# Patient Record
Sex: Female | Born: 1980 | Hispanic: No | Marital: Single | Smoking: Current every day smoker
Health system: Southern US, Community
[De-identification: ages and names within clinical notes are randomized; demographics above are authoritative.]

## PROBLEM LIST (undated history)

## (undated) DIAGNOSIS — B009 Herpesviral infection, unspecified: Secondary | ICD-10-CM

## (undated) DIAGNOSIS — I10 Essential (primary) hypertension: Secondary | ICD-10-CM

## (undated) DIAGNOSIS — N920 Excessive and frequent menstruation with regular cycle: Secondary | ICD-10-CM

## (undated) DIAGNOSIS — Q625 Duplication of ureter: Secondary | ICD-10-CM

## (undated) DIAGNOSIS — K859 Acute pancreatitis without necrosis or infection, unspecified: Secondary | ICD-10-CM

## (undated) DIAGNOSIS — E785 Hyperlipidemia, unspecified: Secondary | ICD-10-CM

## (undated) HISTORY — DX: Essential (primary) hypertension: I10

## (undated) HISTORY — PX: CHOLECYSTECTOMY: SHX55

## (undated) HISTORY — DX: Hyperlipidemia, unspecified: E78.5

## (undated) HISTORY — DX: Excessive and frequent menstruation with regular cycle: N92.0

## (undated) HISTORY — PX: OTHER SURGICAL HISTORY: SHX169

## (undated) HISTORY — DX: Duplication of ureter: Q62.5

---
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
## 2007-11-16 DIAGNOSIS — Q625 Duplication of ureter: Secondary | ICD-10-CM

## 2007-11-16 HISTORY — DX: Duplication of ureter: Q62.5

## 2007-11-16 HISTORY — PX: OTHER SURGICAL HISTORY: SHX169

## 2007-11-17 ENCOUNTER — Ambulatory Visit (HOSPITAL_BASED_OUTPATIENT_CLINIC_OR_DEPARTMENT_OTHER): Admission: RE | Admit: 2007-11-17 | Discharge: 2007-11-17 | Payer: Self-pay | Admitting: Gynecology

## 2007-11-17 ENCOUNTER — Encounter: Payer: Self-pay | Admitting: Gynecology

## 2008-05-28 ENCOUNTER — Emergency Department (HOSPITAL_COMMUNITY): Admission: EM | Admit: 2008-05-28 | Discharge: 2008-05-29 | Payer: Self-pay | Admitting: Emergency Medicine

## 2008-11-27 ENCOUNTER — Ambulatory Visit: Payer: Self-pay | Admitting: Diagnostic Radiology

## 2008-11-27 ENCOUNTER — Emergency Department (HOSPITAL_BASED_OUTPATIENT_CLINIC_OR_DEPARTMENT_OTHER): Admission: EM | Admit: 2008-11-27 | Discharge: 2008-11-27 | Payer: Self-pay | Admitting: Emergency Medicine

## 2010-06-05 ENCOUNTER — Emergency Department (HOSPITAL_BASED_OUTPATIENT_CLINIC_OR_DEPARTMENT_OTHER): Admission: EM | Admit: 2010-06-05 | Discharge: 2010-06-05 | Payer: Self-pay | Admitting: Emergency Medicine

## 2010-10-10 ENCOUNTER — Emergency Department (HOSPITAL_BASED_OUTPATIENT_CLINIC_OR_DEPARTMENT_OTHER): Admission: EM | Admit: 2010-10-10 | Discharge: 2010-10-10 | Payer: Self-pay | Admitting: Emergency Medicine

## 2011-01-30 LAB — RAPID STREP SCREEN (MED CTR MEBANE ONLY): Streptococcus, Group A Screen (Direct): NEGATIVE

## 2011-03-30 NOTE — Op Note (Signed)
Anita Montoya, Anita Montoya                ACCOUNT NO.:  [id number]   MEDICAL RECORD NO.:  [id number]         PATIENT TYPE:  HAMB   LOCATION:                               FACILITY:  [facility]   PHYSICIAN:  Timothy P. Fontaine, M.D.DATE OF BIRTH:  [birth year]   DATE OF PROCEDURE:  11/17/2007  DATE OF DISCHARGE:                               OPERATIVE REPORT   THERE IS NO DICTATION FOR THIS JOB NUMBER.      Timothy P. Fontaine, M.D.  Electronically Signed     TPF/MEDQ  D:  11/17/2007  T:  11/17/2007  Job:  [id number]

## 2011-03-30 NOTE — Op Note (Signed)
Anita Montoya, Anita Montoya                ACCOUNT NO.:  [id number]   MEDICAL RECORD NO.:  [id number]          PATIENT TYPE:  AMB   LOCATION:  [facility]                         FACILITY:  [facility]   PHYSICIAN:  Timothy P. Fontaine, M.D.DATE OF BIRTH:  1981-04-22   DATE OF PROCEDURE:  11/17/2007  DATE OF DISCHARGE:                               OPERATIVE REPORT   PREOPERATIVE DIAGNOSIS:  Endometrial polyp.   POSTOPERATIVE DIAGNOSIS:  Endometrial polyp.   PROCEDURE:  Hysteroscopic endometrial polypectomy, D&C.   SURGEON:  Timothy P. Fontaine, M.D.   ANESTHETIC:  General with 1% lidocaine paracervical block.   COMPLICATIONS:  None.   ESTIMATED BLOOD LOSS:  Minimal, sorbitol discrepancy minimal.   SPECIMEN:  1. Endometrial curetting.  2. Endometrial polyp to pathology   FINDINGS:  EUA:  External BUS, vagina normal.  Cervix normal.  Bimanual:  Uterus grossly normal.  Adnexa without gross masses.  Hysteroscopic with  classic endometrial polyp from mid posterior endometrial cavity removed  in its entirety intact.  Hysteroscopy was otherwise normal and adequate,  noting an atrophic endometrial pattern.   PROCEDURE:  The patient was taken to the operating room and underwent  general anesthesia, was placed in the low dorsal lithotomy position,  received a perineal/vaginal preparation with Betadine solution.  Bladder  emptied with in-and-out Foley catheterization.  EUA performed and the  patient draped in the usual fashion.  The cervix was visualized with the  speculum and anterior lip grasped with a single-tooth tenaculum.  Paracervical block using 1% lidocaine was placed, a total of 10 mL.  The  cervix was gently gradually dilated to admit the operative hysteroscope  and hysteroscopy was performed with findings noted above.  Using the  right-angle resectoscopic loupe, the polyp was excised at its base and  removed intact, sent to pathology.  A sharp curettage was then performed  with scant  return, consistent with the atrophic type pattern on the  endometrium, and this was also sent to pathology as a separate specimen.  Re-hysteroscopy showed good distention, no evidence of perforation.  No  abnormalities.  The  instruments were removed, tenaculum removed.  Hemostasis visualized.  Speculum removed.  The patient placed in the supine position, awakened  without difficulty, and taken to the recovery room in good condition,  having received Toradol in the operating room.      Timothy P. Fontaine, M.D.  Electronically Signed     TPF/MEDQ  D:  11/17/2007  T:  11/17/2007  Job:  [id number]

## 2011-04-02 NOTE — H&P (Signed)
Anita Montoya, Anita Montoya                ACCOUNT NO.:  [id number]   MEDICAL RECORD NO.:  [id number]           PATIENT TYPE:   LOCATION:                                 FACILITY:   PHYSICIAN:  Timothy P. Fontaine, M.D.DATE OF BIRTH:  [birth year]   DATE OF ADMISSION:  10/27/2007  DATE OF DISCHARGE:                              HISTORY & PHYSICAL   CHIEF COMPLAINT:  1. Irregular bleeding.  2. Endometrial polyp.   HISTORY OF PRESENT ILLNESS:  A 30 year old, gravida 0 with a long  history of irregular bleeding with long intervals of amenorrhea.  Outpatient evaluation included a sonohystogram which showed an  endometrial defect consistent with a polyp.  She is admitted at this  time for a hysteroscopy, D&C and removal of her polyp.   PAST MEDICAL HISTORY:  Significant for hypertension.   PAST SURGICAL HISTORY:  None.   ALLERGIES:  No medications.   CURRENT MEDICATIONS:  Lisinopril 10 mg daily.   REVIEW OF SYSTEMS:  Noncontributory.   FAMILY HISTORY:  Noncontributory.   SOCIAL HISTORY:  Noncontributory.   ADMISSION PHYSICAL EXAMINATION:  VITAL SIGNS:  Afebrile.  Vital signs  are stable.  HEENT: Normal.  LUNGS:  Clear.  CARDIAC:  Regular rate.  No rubs, murmurs or gallops.  ABDOMINAL:  Obese without gross masses, tenderness, rebound,  organomegaly.  PELVIC:  External BUS, vagina normal.  Cervix grossly  normal.  Uterus normal size, limited by abdominal girth.  Adnexa without  gross masses or tenderness.   ASSESSMENT:  A 30 year old, gravida 0 with a long history of irregular  menses consistent with a PCO (polycystic ovary)-type pattern with long  bouts of amenorrhea.  Evaluation as an outpatient included a  sonohystogram which showed an endometrial defect consistent with an  endometrial polyp.   She is admitted at this time for hysteroscopy, D&C and removal of the  polyp.  I reviewed the proposed surgery with the patient, the expected  intraoperative/postoperative courses, use  of the resectoscope, D&C.  The  acute risks to include uterine perforation, damage to internal organs  either immediately recognized, delay recognized including bowel,  bladder, ureters, vessels and nerves necessitating major exploratory  reparative surgeries, future reparative surgeries, bowel resection,  ostomy formation was all discussed, understood and accepted.  The risks  of hemorrhage necessitating transfusion and risks of transfusion  including transfusion reaction, hepatitis, HIV, mad cow disease and  other unknown entities was all discussed, understood and accepted.  The  risks of infection requiring prolonged antibiotics, opening and draining  of abscesses all reviewed with her.  Absorption of distension medium,  metabolic complications including coma and seizures was reviewed.  Long-  term management of her irregular menses was also discussed, which will  be further discussed postoperatively pending pathology results from the  [facility].  The patient's questions were answered to her satisfaction.  She is  ready to proceed with surgery.      Timothy P. Fontaine, M.D.  Electronically Signed     TPF/MEDQ  D:  10/24/2007  T:  10/24/2007  Job:  [id number]

## 2011-08-05 LAB — POCT PREGNANCY, URINE
Operator id: 118191
Preg Test, Ur: NEGATIVE

## 2011-08-12 LAB — POCT I-STAT, CHEM 8
BUN: 8
Calcium, Ion: 1.16
Chloride: 105
HCT: 45
Potassium: 3.5
Sodium: 141

## 2011-08-12 LAB — DIFFERENTIAL
Basophils Absolute: 0.2 — ABNORMAL HIGH
Basophils Relative: 1
Eosinophils Absolute: 0.4
Eosinophils Relative: 3
Lymphs Abs: 3.5
Neutrophils Relative %: 62

## 2011-08-12 LAB — CBC
HCT: 42.6
MCV: 77.9 — ABNORMAL LOW
Platelets: 414 — ABNORMAL HIGH
RDW: 15
WBC: 13.3 — ABNORMAL HIGH

## 2011-08-12 LAB — URINALYSIS, ROUTINE W REFLEX MICROSCOPIC
Glucose, UA: NEGATIVE
Ketones, ur: 15 — AB
Nitrite: NEGATIVE
Specific Gravity, Urine: 1.028
pH: 5.5

## 2011-08-12 LAB — HEPATIC FUNCTION PANEL
Albumin: 3.6
Alkaline Phosphatase: 88
Indirect Bilirubin: 0.4
Total Protein: 7.7

## 2011-08-12 LAB — LIPASE, BLOOD: Lipase: 19

## 2013-08-15 DIAGNOSIS — E785 Hyperlipidemia, unspecified: Secondary | ICD-10-CM

## 2013-08-15 HISTORY — DX: Hyperlipidemia, unspecified: E78.5

## 2013-12-15 ENCOUNTER — Encounter (INDEPENDENT_AMBULATORY_CARE_PROVIDER_SITE_OTHER): Payer: Self-pay

## 2013-12-15 ENCOUNTER — Ambulatory Visit: Payer: Self-pay | Attending: Internal Medicine | Admitting: Family Medicine

## 2013-12-15 VITALS — BP 184/141 | HR 94 | Temp 98.9°F | Resp 14 | Ht 66.0 in | Wt 308.8 lb

## 2013-12-15 DIAGNOSIS — I10 Essential (primary) hypertension: Secondary | ICD-10-CM

## 2013-12-15 DIAGNOSIS — Z23 Encounter for immunization: Secondary | ICD-10-CM

## 2013-12-15 DIAGNOSIS — N760 Acute vaginitis: Secondary | ICD-10-CM

## 2013-12-15 DIAGNOSIS — N898 Other specified noninflammatory disorders of vagina: Secondary | ICD-10-CM | POA: Insufficient documentation

## 2013-12-15 DIAGNOSIS — Z124 Encounter for screening for malignant neoplasm of cervix: Secondary | ICD-10-CM

## 2013-12-15 DIAGNOSIS — N920 Excessive and frequent menstruation with regular cycle: Secondary | ICD-10-CM

## 2013-12-15 DIAGNOSIS — L680 Hirsutism: Secondary | ICD-10-CM

## 2013-12-15 DIAGNOSIS — E669 Obesity, unspecified: Secondary | ICD-10-CM | POA: Insufficient documentation

## 2013-12-15 DIAGNOSIS — N921 Excessive and frequent menstruation with irregular cycle: Secondary | ICD-10-CM

## 2013-12-15 LAB — CBC
HEMATOCRIT: 45.7 % (ref 36.0–46.0)
Hemoglobin: 15.5 g/dL — ABNORMAL HIGH (ref 12.0–15.0)
MCH: 26.1 pg (ref 26.0–34.0)
MCHC: 33.9 g/dL (ref 30.0–36.0)
MCV: 76.8 fL — AB (ref 78.0–100.0)
Platelets: 436 10*3/uL — ABNORMAL HIGH (ref 150–400)
RBC: 5.95 MIL/uL — ABNORMAL HIGH (ref 3.87–5.11)
RDW: 14.9 % (ref 11.5–15.5)
WBC: 11.1 10*3/uL — ABNORMAL HIGH (ref 4.0–10.5)

## 2013-12-15 LAB — LIPID PANEL
CHOLESTEROL: 178 mg/dL (ref 0–200)
HDL: 44 mg/dL (ref >39)
LDL CALC: 115 mg/dL — AB (ref 0–99)
TRIGLYCERIDES: 94 mg/dL (ref <150)
Total CHOL/HDL Ratio: 4 Ratio
VLDL: 19 mg/dL (ref 0–40)

## 2013-12-15 LAB — COMPREHENSIVE METABOLIC PANEL
ALBUMIN: 4.2 g/dL (ref 3.5–5.2)
ALK PHOS: 90 U/L (ref 39–117)
ALT: 20 U/L (ref 0–35)
AST: 14 U/L (ref 0–37)
BILIRUBIN TOTAL: 0.3 mg/dL (ref 0.2–1.2)
BUN: 9 mg/dL (ref 6–23)
CO2: 28 mEq/L (ref 19–32)
Calcium: 9.2 mg/dL (ref 8.4–10.5)
Chloride: 102 mEq/L (ref 96–112)
Creat: 0.69 mg/dL (ref 0.50–1.10)
GLUCOSE: 60 mg/dL — AB (ref 70–99)
POTASSIUM: 3.5 meq/L (ref 3.5–5.3)
Sodium: 138 mEq/L (ref 135–145)
Total Protein: 7.8 g/dL (ref 6.0–8.3)

## 2013-12-15 LAB — POCT URINE PREGNANCY: PREG TEST UR: NEGATIVE

## 2013-12-15 LAB — TSH: TSH: 1.024 u[IU]/mL (ref 0.350–4.500)

## 2013-12-15 MED ORDER — HYDROCHLOROTHIAZIDE 25 MG PO TABS: 25.0000 mg | ORAL_TABLET | Freq: Every day | ORAL | Status: DC

## 2013-12-15 MED ORDER — MEDROXYPROGESTERONE ACETATE 10 MG PO TABS: 10.0000 mg | ORAL_TABLET | Freq: Every day | ORAL | Status: DC

## 2013-12-15 MED ORDER — AMLODIPINE BESYLATE 10 MG PO TABS: 10.0000 mg | ORAL_TABLET | Freq: Every day | ORAL | Status: DC

## 2013-12-15 NOTE — Assessment & Plan Note (Addendum)
A: uncontrolled due to not having medication, P: BMP today  Start HCTZ 25 daily and norvasc 10 daily

## 2013-12-15 NOTE — Progress Notes (Signed)
Pt is here to establish care. Has a history of hypertension and needs refills on all medications. Complains of abdominal pain x1 month; has irregular menstrual periods that will last up to 3 months. Also complains of a burning sensation in the lower abdomen; pain in lower back in the mornings. No pain today. Taking Tylenol OTC for pain; has helped pain.

## 2013-12-15 NOTE — Assessment & Plan Note (Addendum)
A: enlarged uterus concerning for uterine fibroids on exam. Negative U preg.  P: CBC Gc/Chlam Pap Uterine u/s TSH Provera 10 mg daily x 5 days

## 2013-12-15 NOTE — Assessment & Plan Note (Signed)
A: obesity in the setting of hirsutism and AUB concerning for PCOS.  P:  Pelvic US Checked CBG-normal at 60 TSH normal  CBC wnl, ? Hemoconcentration

## 2013-12-15 NOTE — Assessment & Plan Note (Signed)
?   PCOS. Pending pelvic U/S. Normal CBGs. No diabetes.

## 2013-12-15 NOTE — Patient Instructions (Signed)
[name],  Thank you for coming in today. I was a pleasure meeting you.   Please start provera one tab daily starting tomorrow for 5 days. Repeat monthly as needed as we work up this  Bleeding.  Two new BP meds.  I will be in touch with results including ultrasound. Plan to f/u in 2 weeks for BP recheck. Plan to f/u with MD in 4-6 weeks   Dr. Armen Pickup   Dysfunctional Uterine Bleeding Normally, menstrual periods begin between ages 8 to 67 in young women. A normal menstrual cycle/period may begin every 23 days up to 35 days and lasts from 1 to 7 days. Around 12 to 14 days before your menstrual period starts, ovulation (ovary produces an egg) occurs. When counting the time between menstrual periods, count from the first day of bleeding of the previous period to the first day of bleeding of the next period. Dysfunctional (abnormal) uterine bleeding is bleeding that is different from a normal menstrual period. Your periods may come earlier or later than usual. They may be lighter, have blood clots or be heavier. You may have bleeding between periods, or you may skip one period or more. You may have bleeding after sexual intercourse, bleeding after menopause, or no menstrual period. CAUSES   Pregnancy (normal, miscarriage, tubal).  IUDs (intrauterine device, birth control).  Birth control pills.  Hormone treatment.  Menopause.  Infection of the cervix.  Blood clotting problems.  Infection of the inside lining of the uterus.  Endometriosis, inside lining of the uterus growing in the pelvis and other female organs.  Adhesions (scar tissue) inside the uterus.  Obesity or severe weight loss.  Uterine polyps inside the uterus.  Cancer of the vagina, cervix, or uterus.  Ovarian cysts or polycystic ovary syndrome.  Medical problems (diabetes, thyroid disease).  Uterine fibroids (noncancerous tumor).  Problems with your female hormones.  Endometrial hyperplasia, very thick  lining and enlarged cells inside of the uterus.  Medicines that interfere with ovulation.  Radiation to the pelvis or abdomen.  Chemotherapy. DIAGNOSIS   Your doctor will discuss the history of your menstrual periods, medicines you are taking, changes in your weight, stress in your life, and any medical problems you may have.  Your doctor will do a physical and pelvic examination.  Your doctor may want to perform certain tests to make a diagnosis, such as:  Pap test.  Blood tests.  Cultures for infection.  CT scan.  Ultrasound.  Hysteroscopy.  Laparoscopy.  MRI.  Hysterosalpingography.  D and C.  Endometrial biopsy. TREATMENT  Treatment will depend on the cause of the dysfunctional uterine bleeding (DUB). Treatment may include:  Observing your menstrual periods for a couple of months.  Prescribing medicines for medical problems, including:  Antibiotics.  Hormones.  Birth control pills.  Removing an IUD (intrauterine device, birth control).  Surgery:  D and C (scrape and remove tissue from inside the uterus).  Laparoscopy (examine inside the abdomen with a lighted tube).  Uterine ablation (destroy lining of the uterus with electrical current, laser, heat, or freezing).  Hysteroscopy (examine cervix and uterus with a lighted tube).  Hysterectomy (remove the uterus). HOME CARE INSTRUCTIONS   If medicines were prescribed, take exactly as directed. Do not change or switch medicines without consulting your caregiver.  Long term heavy bleeding may result in iron deficiency. Your caregiver may have prescribed iron pills. They help replace the iron that your body lost from heavy bleeding. Take exactly as directed.  Do not take aspirin or medicines that contain aspirin one week before or during your menstrual period. Aspirin may make the bleeding worse.  If you need to change your sanitary pad or tampon more than once every 2 hours, stay in bed with your  feet elevated and a cold pack on your lower abdomen. Rest as much as possible, until the bleeding stops or slows down.  Eat well-balanced meals. Eat foods high in iron. Examples are:  Leafy green vegetables.  Whole-grain breads and cereals.  Eggs.  Meat.  Liver.  Do not try to lose weight until the abnormal bleeding has stopped and your blood iron level is back to normal. Do not lift more than ten pounds or do strenuous activities when you are bleeding.  For a couple of months, make note on your calendar, marking the start and ending of your period, and the type of bleeding (light, medium, heavy, spotting, clots or missed periods). This is for your caregiver to better evaluate your problem. SEEK MEDICAL CARE IF:   You develop nausea (feeling sick to your stomach) and vomiting, dizziness, or diarrhea while you are taking your medicine.  You are getting lightheaded or weak.  You have any problems that may be related to the medicine you are taking.  You develop pain with your DUB.  You want to remove your IUD.  You want to stop or change your birth control pills or hormones.  You have any type of abnormal bleeding mentioned above.  You are over 35 years old and have not had a menstrual period yet.  You are 33 years old and you are still having menstrual periods.  You have any of the symptoms mentioned above.  You develop a rash. SEEK IMMEDIATE MEDICAL CARE IF:   An oral temperature above 102 F (38.9 C) develops.  You develop chills.  You are changing your sanitary pad or tampon more than once an hour.  You develop abdominal pain.  You pass out or faint. Document Released: 10/29/2000 Document Revised: 01/24/2012 Document Reviewed: 09/30/2009 [facility] Patient Information 2014 [place], [place].

## 2013-12-15 NOTE — Progress Notes (Signed)
   Subjective:    Patient ID: Anita Montoya, female    DOB: 12-15-1980, 33 y.o.   MRN: [id number]  HPI 33 year old female presents to establish primary care and discuss the following:  #1 vaginal bleeding: Patient reports 3 months of daily vaginal bleeding ranging from light to moderate. This is in  setting of a history of irregular periods since menarche (age 33). Bleeding is associated with a mild to moderate bilateral lower abdominal cramping. There has been no associated fever, chills, weight loss, nausea, vomiting. She admits to easy bruising, gum bleeding. No bleeding from other sites. Patient is sexually active ith one long-term partner is not using contraception. She's previously used OCPs in the past but they worsened her hirsutism.  #2 obesity: Patient with obesity since childhood. She's also had facial hair chest has since puberty. She's never been pregnant has never tried to become pregnant. She reports a family history of her maternal on having similar abnormal uterine bleeding, facial hair, obesity and difficulty becoming pregnant. She reports her mother had no similar symptoms. She has no sisters. She has never had issues with hyperglycemia.  #3 HTN: since 1995. No medications x many months due to lack of prescriber. Denies HA, vision change, CP, SOB and LE edema.   Past Surgical History  Procedure Laterality Date  . Gallbadder removal    . Uterine poly removed   2009    heavy menstrual bleeding    Review of Systems As per history of present illness    Objective:   Physical Exam BP 184/141  Pulse 94  Temp(Src) 98.9 F (37.2 C) (Oral)  Resp 14  Ht 5\' 6"  (1.676 m)  Wt 308 lb 12.8 oz (140.071 kg)  BMI 49.87 kg/m2  SpO2 98%  LMP 09/13/2013 General appearance: alert, cooperative, no distress and morbidly obese Lungs: clear to auscultation bilaterally Heart: regular rate and rhythm, S1, S2 normal, no murmur, click, rub or gallop Abdomen: obeses, NABS, mild TTP b/l LQ w/o  rebound or guarding  Pelvic:  External genitalia: normal Vagina: normal w/o lesions. Scant white curd like and mucoid discharge. Cervix:  Nulliparous. Scant blood. No CMT Bimanual: uterus enlarge, with palpable mass on L side most notable.  B/l adnexal tenderness w/o rebound or guarding. No mass. Ext: no edema  Skin: hirsutism noted on chin and lower face and anterior chest   U preg: negative      Assessment & Plan:

## 2013-12-17 ENCOUNTER — Telehealth: Payer: Self-pay | Admitting: *Deleted

## 2013-12-17 NOTE — Telephone Encounter (Signed)
Left a voicemail for pt to give us a call back. 

## 2013-12-17 NOTE — Telephone Encounter (Signed)
Contacted pt to notify her of her US Pelvic appointment on 12/20/13 at 9:00am. Pt must have a full bladder and arrive 15 minutes prior.

## 2013-12-18 ENCOUNTER — Encounter: Payer: Self-pay | Admitting: Family Medicine

## 2013-12-20 ENCOUNTER — Ambulatory Visit (HOSPITAL_COMMUNITY)
Admission: RE | Admit: 2013-12-20 | Discharge: 2013-12-20 | Disposition: A | Discharge status: Home / Self Care | Payer: PRIVATE HEALTH INSURANCE | Source: Ambulatory Visit | Attending: Family Medicine | Admitting: Family Medicine

## 2013-12-20 ENCOUNTER — Telehealth: Payer: Self-pay | Admitting: *Deleted

## 2013-12-20 DIAGNOSIS — N920 Excessive and frequent menstruation with regular cycle: Secondary | ICD-10-CM | POA: Insufficient documentation

## 2013-12-20 DIAGNOSIS — N921 Excessive and frequent menstruation with irregular cycle: Secondary | ICD-10-CM

## 2013-12-20 DIAGNOSIS — N83209 Unspecified ovarian cyst, unspecified side: Secondary | ICD-10-CM | POA: Insufficient documentation

## 2013-12-20 NOTE — Telephone Encounter (Signed)
Message copied by Ivone Licht, [place] R on Thu Dec 20, 2013  4:32 PM ------      Message from: Dessa PhiFUNCHES, JOSALYN      Created: Thu Dec 20, 2013  8:51 AM       [place], please inform patient via phone.       Normal pap, will repeat in 3 years.       Negative GC and chlam.       ------

## 2013-12-21 ENCOUNTER — Telehealth: Payer: Self-pay | Admitting: Family Medicine

## 2013-12-21 NOTE — Telephone Encounter (Signed)
Called patient. Normal pelvic US. No fibroids.  Simple follicular cyst in L ovary mildly enlarged. Reassuring U/S.   Normal pap. Negative Gc/chlam.  Patient reports taking provera x 5 days. Still bleeding, very light.  Having some sharp cramps. Comes and goes. No fever, chills, N/V.   Plan: Reassurance Repeat provera next month is needed.

## 2014-03-31 ENCOUNTER — Emergency Department (HOSPITAL_BASED_OUTPATIENT_CLINIC_OR_DEPARTMENT_OTHER): Payer: PRIVATE HEALTH INSURANCE

## 2014-03-31 ENCOUNTER — Emergency Department (HOSPITAL_BASED_OUTPATIENT_CLINIC_OR_DEPARTMENT_OTHER)
Admission: EM | Admit: 2014-03-31 | Discharge: 2014-03-31 | Disposition: A | Discharge status: Home / Self Care | Payer: PRIVATE HEALTH INSURANCE | Attending: Emergency Medicine | Admitting: Emergency Medicine

## 2014-03-31 ENCOUNTER — Encounter (HOSPITAL_BASED_OUTPATIENT_CLINIC_OR_DEPARTMENT_OTHER): Payer: Self-pay | Admitting: Emergency Medicine

## 2014-03-31 DIAGNOSIS — F17200 Nicotine dependence, unspecified, uncomplicated: Secondary | ICD-10-CM | POA: Insufficient documentation

## 2014-03-31 DIAGNOSIS — Z79899 Other long term (current) drug therapy: Secondary | ICD-10-CM | POA: Insufficient documentation

## 2014-03-31 DIAGNOSIS — I10 Essential (primary) hypertension: Secondary | ICD-10-CM | POA: Insufficient documentation

## 2014-03-31 DIAGNOSIS — N3090 Cystitis, unspecified without hematuria: Secondary | ICD-10-CM | POA: Insufficient documentation

## 2014-03-31 DIAGNOSIS — Q649 Congenital malformation of urinary system, unspecified: Secondary | ICD-10-CM | POA: Insufficient documentation

## 2014-03-31 DIAGNOSIS — N390 Urinary tract infection, site not specified: Secondary | ICD-10-CM

## 2014-03-31 DIAGNOSIS — E785 Hyperlipidemia, unspecified: Secondary | ICD-10-CM | POA: Insufficient documentation

## 2014-03-31 LAB — URINALYSIS, ROUTINE W REFLEX MICROSCOPIC
GLUCOSE, UA: NEGATIVE mg/dL
Ketones, ur: 15 mg/dL — AB
Nitrite: NEGATIVE
PH: 6 (ref 5.0–8.0)
PROTEIN: 100 mg/dL — AB
Specific Gravity, Urine: 1.027 (ref 1.005–1.030)
Urobilinogen, UA: 0.2 mg/dL (ref 0.0–1.0)

## 2014-03-31 LAB — URINE MICROSCOPIC-ADD ON

## 2014-03-31 LAB — PREGNANCY, URINE: Preg Test, Ur: NEGATIVE

## 2014-03-31 MED ORDER — PHENAZOPYRIDINE HCL 200 MG PO TABS: 200.0000 mg | ORAL_TABLET | Freq: Three times a day (TID) | ORAL | Status: DC

## 2014-03-31 MED ORDER — CEPHALEXIN 500 MG PO CAPS: 500.0000 mg | ORAL_CAPSULE | Freq: Four times a day (QID) | ORAL | Status: DC

## 2014-03-31 MED ORDER — CEPHALEXIN 250 MG PO CAPS
1000.0000 mg | ORAL_CAPSULE | Freq: Once | ORAL | Status: AC
  Administered 2014-03-31: 1000 mg via ORAL
  Filled 2014-03-31: qty 4

## 2014-03-31 MED ORDER — PHENAZOPYRIDINE HCL 100 MG PO TABS
200.0000 mg | ORAL_TABLET | Freq: Once | ORAL | Status: AC
  Administered 2014-03-31: 200 mg via ORAL
  Filled 2014-03-31: qty 2

## 2014-03-31 MED ORDER — CEPHALEXIN 250 MG PO CAPS
500.0000 mg | ORAL_CAPSULE | Freq: Once | ORAL | Status: DC
  Filled 2014-03-31: qty 2

## 2014-03-31 NOTE — ED Provider Notes (Signed)
CSN: [id number]     Arrival date & time 03/31/14  1801 History   First MD Initiated Contact with Patient 03/31/14 1817     Chief Complaint  Patient presents with  . Flank Pain     (Consider location/radiation/quality/duration/timing/severity/associated sxs/prior Treatment) Patient is a 33 y.o. female presenting with flank pain. The history is provided by the patient. No language interpreter was used.  Flank Pain This is a new problem. Pertinent negatives include no abdominal pain, fever, myalgias, nausea or rash. Associated symptoms comments: Left flank pain, hematuria and painful urination for the past 1 day. Pain is only associated with urination. No nausea, vomiting or fever. .    Past Medical History  Diagnosis Date  . HLD (hyperlipidemia) 08/2013  . Menorrhagia 1989  . Duplicated left renal collecting system 2009    noted on 2009 CT abdomen and pelvis   . Hypertension 1995   Past Surgical History  Procedure Laterality Date  . Gallbadder removal    . Uterine poly removed   2009    heavy menstrual bleeding   . Cholecystectomy     Family History  Problem Relation Age of Onset  . Diabetes Father   . Heart disease Brother    History  Substance Use Topics  . Smoking status: Current Some Day Smoker -- 1.00 packs/day    Types: Cigarettes  . Smokeless tobacco: Never Used  . Alcohol Use: Yes     Comment: occasional   OB History   Grav Para Term Preterm Abortions TAB SAB Ect Mult Living   0 0             Review of Systems  Constitutional: Negative for fever.  Gastrointestinal: Negative for nausea and abdominal pain.  Genitourinary: Positive for dysuria, hematuria and flank pain.  Musculoskeletal: Negative for myalgias.  Skin: Negative for rash.      Allergies  Review of patient's allergies indicates no known allergies.  Home Medications   Prior to Admission medications   Medication Sig Start Date End Date Taking? Authorizing Provider  amLODipine (NORVASC) 10  MG tablet Take 1 tablet (10 mg total) by mouth daily. 12/15/13  Yes Josalyn C Funches, MD  hydrochlorothiazide (HYDRODIURIL) 25 MG tablet Take 1 tablet (25 mg total) by mouth daily. 12/15/13  Yes Josalyn C Funches, MD  medroxyPROGESTERone (PROVERA) 10 MG tablet Take 1 tablet (10 mg total) by mouth daily. One tab daily for five days starting on the first day of the month. Repeat monthly to regulate cycle. 12/15/13   Josalyn C Funches, MD   BP 164/108  Pulse 119  Temp(Src) 97.9 F (36.6 C) (Oral)  Resp 24  Ht 5\' 3"  (1.6 m)  Wt 315 lb (142.883 kg)  BMI 55.81 kg/m2  SpO2 99% Physical Exam  Constitutional: She is oriented to person, place, and time. She appears well-developed and well-nourished. No distress.  Neck: Normal range of motion.  Pulmonary/Chest: Effort normal.  Abdominal: There is no tenderness.  Obese abdomen that is soft.   Genitourinary:  No left CVA tenderness.   Neurological: She is alert and oriented to person, place, and time.  Skin: Skin is warm and dry.    ED Course  Procedures (including critical care time) Labs Review Labs Reviewed  URINALYSIS, ROUTINE W REFLEX MICROSCOPIC - Abnormal; Notable for the following:    Color, Urine AMBER (*)    APPearance CLOUDY (*)    Hgb urine dipstick MODERATE (*)    Bilirubin Urine SMALL (*)  Ketones, ur 15 (*)    Protein, ur 100 (*)    Leukocytes, UA SMALL (*)    All other components within normal limits  URINE MICROSCOPIC-ADD ON - Abnormal; Notable for the following:    Bacteria, UA FEW (*)    All other components within normal limits  PREGNANCY, URINE    Imaging Review Ct Abdomen Pelvis Wo Contrast  03/31/2014   CLINICAL DATA:  Left flank pain.  Hematuria.  EXAM: CT ABDOMEN AND PELVIS WITHOUT CONTRAST  TECHNIQUE: Multidetector CT imaging of the abdomen and pelvis was performed following the standard protocol without IV contrast.  COMPARISON:  US PELVIS COMPLETE dated 12/20/2013; CT ABD W/CM dated 05/29/2008; CT PELVIS W/CM  dated 05/29/2008  FINDINGS: Visualization of the lower thorax demonstrates no consolidative or nodular pulmonary opacities. Normal heart size.  Lack of intravenous contrast material limits evaluation of the solid organ parenchyma. Liver is diffusely low in attenuation compatible with hepatic steatosis. Status post cholecystectomy. Spleen, pancreas and bilateral adrenal glands are unremarkable. Kidneys are symmetric in size. No hydronephrosis.  Urinary bladder is decompressed with significant surrounding fat stranding. There is a 3.4 cm left adnexal cystic lesion, grossly similar in size and appearance compared to CT 05/29/2008. Colon is decompressed. Normal appendix. No abnormal bowel wall thickening or evidence for bowel obstruction. No free fluid or free intraperitoneal air.  No aggressive or acute appearing osseous lesions. Lower lumbar spine degenerative change.  IMPRESSION: Urinary bladder is decompressed however there is extensive surrounding fat stranding. Recommend correlation with urinalysis as findings are suggestive of acute cystitis.   Electronically Signed   By: Annia Beltrew  Davis M.D.   On: 03/31/2014 19:37     EKG Interpretation None      MDM   Final diagnoses:  None    1. Cystitis  She is well appearing. No ureteral or renal stones visualized. Findings on CT c/w cystitis. Abx, pyridium and PCP follow up recommended.     Arnoldo HookerShari A Elexius Minar, PA-C 03/31/14 2017

## 2014-03-31 NOTE — ED Notes (Signed)
Left flank pain this week- hematuria and dysuria x 1 day

## 2014-03-31 NOTE — Discharge Instructions (Signed)
Urinary Tract Infection  Urinary tract infections (UTIs) can develop anywhere along your urinary tract. Your urinary tract is your body's drainage system for removing wastes and extra water. Your urinary tract includes two kidneys, two ureters, a bladder, and a urethra. Your kidneys are a pair of bean-shaped organs. Each kidney is about the size of your fist. They are located below your ribs, one on each side of your spine.  CAUSES  Infections are caused by microbes, which are microscopic organisms, including fungi, viruses, and bacteria. These organisms are so small that they can only be seen through a microscope. Bacteria are the microbes that most commonly cause UTIs.  SYMPTOMS   Symptoms of UTIs may vary by age and gender of the patient and by the location of the infection. Symptoms in young women typically include a frequent and intense urge to urinate and a painful, burning feeling in the bladder or urethra during urination. Older women and men are more likely to be tired, shaky, and weak and have muscle aches and abdominal pain. A fever may mean the infection is in your kidneys. Other symptoms of a kidney infection include pain in your back or sides below the ribs, nausea, and vomiting.  DIAGNOSIS  To diagnose a UTI, your caregiver will ask you about your symptoms. Your caregiver also will ask to provide a urine sample. The urine sample will be tested for bacteria and white blood cells. White blood cells are made by your body to help fight infection.  TREATMENT   Typically, UTIs can be treated with medication. Because most UTIs are caused by a bacterial infection, they usually can be treated with the use of antibiotics. The choice of antibiotic and length of treatment depend on your symptoms and the type of bacteria causing your infection.  HOME CARE INSTRUCTIONS   If you were prescribed antibiotics, take them exactly as your caregiver instructs you. Finish the medication even if you feel better after you  have only taken some of the medication.   Drink enough water and fluids to keep your urine clear or pale yellow.   Avoid caffeine, tea, and carbonated beverages. They tend to irritate your bladder.   Empty your bladder often. Avoid holding urine for long periods of time.   Empty your bladder before and after sexual intercourse.   After a bowel movement, women should cleanse from front to back. Use each tissue only once.  SEEK MEDICAL CARE IF:    You have back pain.   You develop a fever.   Your symptoms do not begin to resolve within 3 days.  SEEK IMMEDIATE MEDICAL CARE IF:    You have severe back pain or lower abdominal pain.   You develop chills.   You have nausea or vomiting.   You have continued burning or discomfort with urination.  MAKE SURE YOU:    Understand these instructions.   Will watch your condition.   Will get help right away if you are not doing well or get worse.  Document Released: 08/11/2005 Document Revised: 05/02/2012 Document Reviewed: 12/10/2011  [employer] Patient Information 2014 [employer].

## 2014-04-02 NOTE — ED Provider Notes (Signed)
Medical screening examination/treatment/procedure(s) were performed by non-physician practitioner and as supervising physician I was immediately available for consultation/collaboration.   EKG Interpretation None       Anita HornJohn M Emerald Shor, MD 04/02/14 [phone]

## 2014-05-28 ENCOUNTER — Emergency Department (HOSPITAL_BASED_OUTPATIENT_CLINIC_OR_DEPARTMENT_OTHER)
Admission: EM | Admit: 2014-05-28 | Discharge: 2014-05-28 | Disposition: A | Discharge status: Home / Self Care | Payer: PRIVATE HEALTH INSURANCE | Attending: Emergency Medicine | Admitting: Emergency Medicine

## 2014-05-28 ENCOUNTER — Emergency Department (HOSPITAL_BASED_OUTPATIENT_CLINIC_OR_DEPARTMENT_OTHER): Payer: PRIVATE HEALTH INSURANCE

## 2014-05-28 ENCOUNTER — Encounter (HOSPITAL_BASED_OUTPATIENT_CLINIC_OR_DEPARTMENT_OTHER): Payer: Self-pay | Admitting: Emergency Medicine

## 2014-05-28 DIAGNOSIS — Z79899 Other long term (current) drug therapy: Secondary | ICD-10-CM | POA: Insufficient documentation

## 2014-05-28 DIAGNOSIS — Z8742 Personal history of other diseases of the female genital tract: Secondary | ICD-10-CM | POA: Insufficient documentation

## 2014-05-28 DIAGNOSIS — Y9389 Activity, other specified: Secondary | ICD-10-CM | POA: Insufficient documentation

## 2014-05-28 DIAGNOSIS — F17200 Nicotine dependence, unspecified, uncomplicated: Secondary | ICD-10-CM | POA: Insufficient documentation

## 2014-05-28 DIAGNOSIS — S93402A Sprain of unspecified ligament of left ankle, initial encounter: Secondary | ICD-10-CM

## 2014-05-28 DIAGNOSIS — Y9289 Other specified places as the place of occurrence of the external cause: Secondary | ICD-10-CM | POA: Insufficient documentation

## 2014-05-28 DIAGNOSIS — Z792 Long term (current) use of antibiotics: Secondary | ICD-10-CM | POA: Insufficient documentation

## 2014-05-28 DIAGNOSIS — Z87718 Personal history of other specified (corrected) congenital malformations of genitourinary system: Secondary | ICD-10-CM | POA: Insufficient documentation

## 2014-05-28 DIAGNOSIS — E785 Hyperlipidemia, unspecified: Secondary | ICD-10-CM | POA: Insufficient documentation

## 2014-05-28 DIAGNOSIS — S93409A Sprain of unspecified ligament of unspecified ankle, initial encounter: Secondary | ICD-10-CM | POA: Insufficient documentation

## 2014-05-28 DIAGNOSIS — I10 Essential (primary) hypertension: Secondary | ICD-10-CM | POA: Insufficient documentation

## 2014-05-28 DIAGNOSIS — X500XXA Overexertion from strenuous movement or load, initial encounter: Secondary | ICD-10-CM | POA: Insufficient documentation

## 2014-05-28 NOTE — ED Provider Notes (Signed)
Medical screening examination/treatment/procedure(s) were performed by non-physician practitioner and as supervising physician I was immediately available for consultation/collaboration.   EKG Interpretation None        Courtney F Horton, MD 05/28/14 2353 

## 2014-05-28 NOTE — ED Notes (Signed)
Patient states that she stepped down yesterday and hurt left ankle

## 2014-05-28 NOTE — ED Notes (Signed)
Patient transported to X-ray 

## 2014-05-28 NOTE — ED Provider Notes (Signed)
CSN: [id number]     Arrival date & time 05/28/14  1640 History   First MD Initiated Contact with Patient 05/28/14 1652     Chief Complaint  Patient presents with  . Ankle Pain     (Consider location/radiation/quality/duration/timing/severity/associated sxs/prior Treatment) Patient is a 33 y.o. female presenting with ankle pain. The history is provided by the patient. No language interpreter was used.  Ankle Pain Location:  Ankle Time since incident:  1 day Injury: yes   Ankle location:  L ankle Pain details:    Quality:  Aching   Radiates to:  Does not radiate   Severity:  Mild   Progression:  Worsening Chronicity:  New Dislocation: no   Foreign body present:  No foreign bodies Prior injury to area:  No Relieved by:  Nothing Worsened by:  Nothing tried Ineffective treatments:  None tried Risk factors: no recent illness     Past Medical History  Diagnosis Date  . HLD (hyperlipidemia) 08/2013  . Menorrhagia 1989  . Duplicated left renal collecting system 2009    noted on 2009 CT abdomen and pelvis   . Hypertension 1995   Past Surgical History  Procedure Laterality Date  . Gallbadder removal    . Uterine poly removed   2009    heavy menstrual bleeding   . Cholecystectomy     Family History  Problem Relation Age of Onset  . Diabetes Father   . Heart disease Brother    History  Substance Use Topics  . Smoking status: Current Some Day Smoker -- 1.00 packs/day    Types: Cigarettes  . Smokeless tobacco: Never Used  . Alcohol Use: Yes     Comment: occasional   OB History   Grav Para Term Preterm Abortions TAB SAB Ect Mult Living   0 0             Review of Systems  All other systems reviewed and are negative.     Allergies  Review of patient's allergies indicates no known allergies.  Home Medications   Prior to Admission medications   Medication Sig Start Date End Date Taking? Authorizing Provider  amLODipine (NORVASC) 10 MG tablet Take 1 tablet (10  mg total) by mouth daily. 12/15/13  Yes Josalyn C Funches, MD  atorvastatin (LIPITOR) 10 MG tablet Take 10 mg by mouth daily.   Yes Historical Provider, MD  hydrochlorothiazide (HYDRODIURIL) 25 MG tablet Take 1 tablet (25 mg total) by mouth daily. 12/15/13  Yes Josalyn C Funches, MD  cephALEXin (KEFLEX) 500 MG capsule Take 1 capsule (500 mg total) by mouth 4 (four) times daily. 03/31/14   Shari A Upstill, PA-C  medroxyPROGESTERone (PROVERA) 10 MG tablet Take 1 tablet (10 mg total) by mouth daily. One tab daily for five days starting on the first day of the month. Repeat monthly to regulate cycle. 12/15/13   Lora Paula, MD  phenazopyridine (PYRIDIUM) 200 MG tablet Take 1 tablet (200 mg total) by mouth 3 (three) times daily. 03/31/14   Shari A Upstill, PA-C   BP 181/126  Pulse 102  Temp(Src) 98.3 F (36.8 C) (Oral)  Resp 18  Ht 5\' 5"  (1.651 m)  Wt 315 lb (142.883 kg)  BMI 52.42 kg/m2  SpO2 100% Physical Exam  Constitutional: She is oriented to person, place, and time.  Musculoskeletal: Normal range of motion. She exhibits tenderness.  Swollen lateral malleolus,   From,  nv and ns intact  Neurological: She is alert and oriented to  person, place, and time. She has normal reflexes.  Skin: Skin is warm.  Psychiatric: She has a normal mood and affect.    ED Course  Procedures (including critical care time) Labs Review Labs Reviewed - No data to display  Imaging Review Dg Ankle Complete Left  05/28/2014   CLINICAL DATA:  Ankle pain. Twisting injury yesterday. Lateral swelling.  EXAM: LEFT ANKLE COMPLETE - 3+ VIEW  COMPARISON:  None.  FINDINGS: Soft tissue swelling is noted about the ankle, most prominently laterally. Joint spaces are preserved. No lytic or blastic osseous lesion is seen. No acute fracture or dislocation is identified.  IMPRESSION: Soft tissue swelling.  No acute osseous abnormality.   Electronically Signed   By: Sebastian AcheAllen  Grady   On: 05/28/2014 17:07     EKG  Interpretation None      MDM   Final diagnoses:  Ankle sprain, left, initial encounter    aso Tylenol See Dr. Pearletha Forgehudnall for recheck in 1 week     Elson AreasLeslie K Sofia, PA-C 05/28/14 1731

## 2014-06-07 ENCOUNTER — Telehealth (HOSPITAL_BASED_OUTPATIENT_CLINIC_OR_DEPARTMENT_OTHER): Payer: Self-pay | Admitting: Emergency Medicine

## 2014-06-07 ENCOUNTER — Encounter (HOSPITAL_BASED_OUTPATIENT_CLINIC_OR_DEPARTMENT_OTHER): Payer: Self-pay | Admitting: Emergency Medicine

## 2014-06-07 ENCOUNTER — Emergency Department (HOSPITAL_BASED_OUTPATIENT_CLINIC_OR_DEPARTMENT_OTHER): Payer: PRIVATE HEALTH INSURANCE

## 2014-06-07 ENCOUNTER — Emergency Department (HOSPITAL_BASED_OUTPATIENT_CLINIC_OR_DEPARTMENT_OTHER)
Admission: EM | Admit: 2014-06-07 | Discharge: 2014-06-07 | Disposition: A | Discharge status: Home / Self Care | Payer: PRIVATE HEALTH INSURANCE | Attending: Emergency Medicine | Admitting: Emergency Medicine

## 2014-06-07 DIAGNOSIS — Y9289 Other specified places as the place of occurrence of the external cause: Secondary | ICD-10-CM | POA: Insufficient documentation

## 2014-06-07 DIAGNOSIS — E785 Hyperlipidemia, unspecified: Secondary | ICD-10-CM | POA: Insufficient documentation

## 2014-06-07 DIAGNOSIS — X500XXA Overexertion from strenuous movement or load, initial encounter: Secondary | ICD-10-CM | POA: Insufficient documentation

## 2014-06-07 DIAGNOSIS — S99929A Unspecified injury of unspecified foot, initial encounter: Principal | ICD-10-CM

## 2014-06-07 DIAGNOSIS — I10 Essential (primary) hypertension: Secondary | ICD-10-CM | POA: Insufficient documentation

## 2014-06-07 DIAGNOSIS — M25572 Pain in left ankle and joints of left foot: Secondary | ICD-10-CM

## 2014-06-07 DIAGNOSIS — S99919A Unspecified injury of unspecified ankle, initial encounter: Principal | ICD-10-CM

## 2014-06-07 DIAGNOSIS — Z79899 Other long term (current) drug therapy: Secondary | ICD-10-CM | POA: Insufficient documentation

## 2014-06-07 DIAGNOSIS — F17200 Nicotine dependence, unspecified, uncomplicated: Secondary | ICD-10-CM | POA: Insufficient documentation

## 2014-06-07 DIAGNOSIS — S8990XA Unspecified injury of unspecified lower leg, initial encounter: Secondary | ICD-10-CM | POA: Insufficient documentation

## 2014-06-07 DIAGNOSIS — Y9389 Activity, other specified: Secondary | ICD-10-CM | POA: Insufficient documentation

## 2014-06-07 DIAGNOSIS — Z8742 Personal history of other diseases of the female genital tract: Secondary | ICD-10-CM | POA: Insufficient documentation

## 2014-06-07 DIAGNOSIS — Z792 Long term (current) use of antibiotics: Secondary | ICD-10-CM | POA: Insufficient documentation

## 2014-06-07 LAB — URIC ACID: Uric Acid, Serum: 5.6 mg/dL (ref 2.4–7.0)

## 2014-06-07 MED ORDER — LIDOCAINE-EPINEPHRINE (PF) 1 %-1:200000 IJ SOLN
20.0000 mL | Freq: Once | INTRAMUSCULAR | Status: AC
  Filled 2014-06-07: qty 20

## 2014-06-07 MED ORDER — OXYCODONE-ACETAMINOPHEN 5-325 MG PO TABS: ORAL_TABLET | ORAL | Status: DC

## 2014-06-07 MED ORDER — OXYCODONE-ACETAMINOPHEN 5-325 MG PO TABS
1.0000 | ORAL_TABLET | Freq: Once | ORAL | Status: AC
  Administered 2014-06-07: 1 via ORAL
  Filled 2014-06-07: qty 1

## 2014-06-07 MED ORDER — LIDOCAINE-EPINEPHRINE 2 %-1:100000 IJ SOLN
INTRAMUSCULAR | Status: AC
  Administered 2014-06-07: 1 mL
  Filled 2014-06-07: qty 1

## 2014-06-07 NOTE — ED Notes (Signed)
Reports continued pain and swelling and to left ankle. Not able to get appointment with follow up

## 2014-06-07 NOTE — Discharge Instructions (Signed)
Follow with your orthopedist on Monday.  Use crutches and do not bear weight on the left ankle.  Rest, Ice intermittently (in the first 24-48 hours), Gentle compression with an Ace wrap, and elevate (Limb above the level of the heart)   Take up to 800mg  of ibuprofen (that is usually 4 over the counter pills)  3 times a day for 5 days. Take with food.  Take percocet for breakthrough pain, do not drink alcohol, drive, care for children or do other critical tasks while taking percocet.  Please follow with your primary care doctor in the next 2 days for a check-up. They must obtain records for further management.   Do not hesitate to return to the Emergency Department for any new, worsening or concerning symptoms.    Ankle Pain Ankle pain is a common symptom. The bones, cartilage, tendons, and muscles of the ankle joint perform a lot of work each day. The ankle joint holds your body weight and allows you to move around. Ankle pain can occur on either side or back of 1 or both ankles. Ankle pain may be sharp and burning or dull and aching. There may be tenderness, stiffness, redness, or warmth around the ankle. The pain occurs more often when a person walks or puts pressure on the ankle. CAUSES  There are many reasons ankle pain can develop. It is important to work with your caregiver to identify the cause since many conditions can impact the bones, cartilage, muscles, and tendons. Causes for ankle pain include:  Injury, including a break (fracture), sprain, or strain often due to a fall, sports, or a high-impact activity.  Swelling (inflammation) of a tendon (tendonitis).  Achilles tendon rupture.  Ankle instability after repeated sprains and strains.  Poor foot alignment.  Pressure on a nerve (tarsal tunnel syndrome).  Arthritis in the ankle or the lining of the ankle.  Crystal formation in the ankle (gout or pseudogout). DIAGNOSIS  A diagnosis is based on your medical history, your  symptoms, results of your physical exam, and results of diagnostic tests. Diagnostic tests may include X-ray exams or a computerized magnetic scan (magnetic resonance imaging, MRI). TREATMENT  Treatment will depend on the cause of your ankle pain and may include:  Keeping pressure off the ankle and limiting activities.  Using crutches or other walking support (a cane or brace).  Using rest, ice, compression, and elevation.  Participating in physical therapy or home exercises.  Wearing shoe inserts or special shoes.  Losing weight.  Taking medications to reduce pain or swelling or receiving an injection.  Undergoing surgery. HOME CARE INSTRUCTIONS   Only take over-the-counter or prescription medicines for pain, discomfort, or fever as directed by your caregiver.  Put ice on the injured area.  Put ice in a plastic bag.  Place a towel between your skin and the bag.  Leave the ice on for 15-20 minutes at a time, 03-04 times a day.  Keep your leg raised (elevated) when possible to lessen swelling.  Avoid activities that cause ankle pain.  Follow specific exercises as directed by your caregiver.  Record how often you have ankle pain, the location of the pain, and what it feels like. This information may be helpful to you and your caregiver.  Ask your caregiver about returning to work or sports and whether you should drive.  Follow up with your caregiver for further examination, therapy, or testing as directed. SEEK MEDICAL CARE IF:   Pain or swelling continues or  worsens beyond 1 week.  You have an oral temperature above 102 F (38.9 C).  You are feeling unwell or have chills.  You are having an increasingly difficult time with walking.  You have loss of sensation or other new symptoms.  You have questions or concerns. MAKE SURE YOU:   Understand these instructions.  Will watch your condition.  Will get help right away if you are not doing well or get  worse. Document Released: 04/21/2010 Document Revised: 01/24/2012 Document Reviewed: 04/21/2010 [facility] Patient Information 2015 [place], [place]. This information is not intended to replace advice given to you by your health care provider. Make sure you discuss any questions you have with your health care provider.

## 2014-06-07 NOTE — ED Notes (Signed)
I informed RN Sue Lushndrea that we don't have a set of crutches that would fit her weight with height.

## 2014-06-07 NOTE — ED Notes (Signed)
Patient transported to X-ray 

## 2014-06-07 NOTE — ED Notes (Signed)
[facility], [facility]. Informed patient that she would need to follow up with them in the am and obtained crutches. Pt was over our weight limit for crutches that we carry in stock. Pt understood and [facility]

## 2014-06-07 NOTE — ED Notes (Signed)
Pa  at bedside. 

## 2014-06-07 NOTE — ED Provider Notes (Signed)
CSN: [id number]     Arrival date & time 06/07/14  1825 History   First MD Initiated Contact with Patient 06/07/14 1831     Chief Complaint  Patient presents with  . Ankle Pain     (Consider location/radiation/quality/duration/timing/severity/associated sxs/prior Treatment) HPI  Petrina Melby is a 33 y.o. female complaining of severe pain and swelling to right ankle. Patient states that she had a slip and fall and twisted the ankle on July 14. She presented for evaluation at that time there was a negative x-ray. She was given a ankle brace. States that she twisted it again 4 days ago. States that the pain has become increasingly more severe and that the swelling has increased significantly. States she can no longer wait in the leg. She's been taking Motrin at home with little relief. States that the pain of having a sheet on the foot is significant and severe. Has appointment with orthopedics on Monday.  Past Medical History  Diagnosis Date  . HLD (hyperlipidemia) 08/2013  . Menorrhagia 1989  . Duplicated left renal collecting system 2009    noted on 2009 CT abdomen and pelvis   . Hypertension 1995   Past Surgical History  Procedure Laterality Date  . Gallbadder removal    . Uterine poly removed   2009    heavy menstrual bleeding   . Cholecystectomy     Family History  Problem Relation Age of Onset  . Diabetes Father   . Heart disease Brother    History  Substance Use Topics  . Smoking status: Current Some Day Smoker -- 1.00 packs/day    Types: Cigarettes  . Smokeless tobacco: Never Used  . Alcohol Use: Yes     Comment: occasional   OB History   Grav Para Term Preterm Abortions TAB SAB Ect Mult Living   0 0             Review of Systems  10 systems reviewed and found to be negative, except as noted in the HPI.   Allergies  Review of patient's allergies indicates no known allergies.  Home Medications   Prior to Admission medications   Medication Sig Start Date  End Date Taking? Authorizing Provider  amLODipine (NORVASC) 10 MG tablet Take 1 tablet (10 mg total) by mouth daily. 12/15/13   Josalyn C Funches, MD  atorvastatin (LIPITOR) 10 MG tablet Take 10 mg by mouth daily.    Historical Provider, MD  cephALEXin (KEFLEX) 500 MG capsule Take 1 capsule (500 mg total) by mouth 4 (four) times daily. 03/31/14   Shari A Upstill, PA-C  hydrochlorothiazide (HYDRODIURIL) 25 MG tablet Take 1 tablet (25 mg total) by mouth daily. 12/15/13   Lora Paula, MD  medroxyPROGESTERone (PROVERA) 10 MG tablet Take 1 tablet (10 mg total) by mouth daily. One tab daily for five days starting on the first day of the month. Repeat monthly to regulate cycle. 12/15/13   Lora Paula, MD  oxyCODONE-acetaminophen (PERCOCET/ROXICET) 5-325 MG per tablet 1 to 2 tabs PO q6hrs  PRN for pain 06/07/14   Joni Reining Bryant Lipps, PA-C  phenazopyridine (PYRIDIUM) 200 MG tablet Take 1 tablet (200 mg total) by mouth 3 (three) times daily. 03/31/14   Shari A Upstill, PA-C   BP 172/128  Pulse 100  Temp(Src) 98 F (36.7 C) (Oral)  Resp 18  Ht 5\' 4"  (1.626 m)  Wt 315 lb (142.883 kg)  BMI 54.04 kg/m2  SpO2 99% Physical Exam  Nursing note and vitals reviewed.  Constitutional: She is oriented to person, place, and time. She appears well-developed and well-nourished. No distress.  HENT:  Head: Normocephalic.  Mouth/Throat: Oropharynx is clear and moist.  Eyes: Conjunctivae and EOM are normal. Pupils are equal, round, and reactive to light.  Neck: Normal range of motion.  Cardiovascular: Normal rate, regular rhythm and intact distal pulses.   Pulmonary/Chest: Effort normal and breath sounds normal. No stridor. No respiratory distress. She has no wheezes. She has no rales. She exhibits no tenderness.  Abdominal: Soft. Bowel sounds are normal. She exhibits no distension and no mass. There is no tenderness. There is no rebound and no guarding.  Musculoskeletal: Normal range of motion. She exhibits edema  and tenderness.  Left ankle: No bony tenderness to palpation along the lateral or medial malleolus. Patient has soft tissue swelling along the lateral malleolus on the anterior and posterior side. No warmth, good range of motion to ankle and toes. Neurovascularly intact.  Neurological: She is alert and oriented to person, place, and time.  Psychiatric: She has a normal mood and affect.    ED Course  ARTHOCENTESIS Date/Time: 06/09/2014 10:25 AM Performed by: Wynetta Emery Authorized by: Wynetta Emery Consent: Verbal consent obtained. written consent obtained. Consent given by: patient Patient consent: the patient's understanding of the procedure matches consent given Procedure consent: procedure consent matches procedure scheduled Patient identity confirmed: verbally with patient and arm band Indications: joint swelling  Body area: ankle Local anesthesia used: yes Anesthesia: local infiltration Local anesthetic: lidocaine 2% with epinephrine Anesthetic total: 2 ml Patient sedated: no Preparation: Patient was prepped and draped in the usual sterile fashion. Needle gauge: 18 G Ultrasound guidance: no Approach: anterior Aspirate amount: 0 ml Comments: Attempted arthrocentesis of the left ankle anterior aspects with no return of fluid.   (including critical care time) Labs Review Labs Reviewed  URIC ACID    Imaging Review Dg Ankle Complete Left  06/07/2014   CLINICAL DATA:  Twisted ankle last week with increasing pain and swelling  EXAM: LEFT ANKLE COMPLETE - 3+ VIEW  COMPARISON:  05/28/2014  FINDINGS: There is moderately severe diffuse soft tissue swelling around the ankle. The mortise is intact. There is no fracture or dislocation. There is no significant change from the prior study.  IMPRESSION: Soft tissue swelling with no evidence of fracture.   Electronically Signed   By: Esperanza Heir M.D.   On: 06/07/2014 19:53     EKG Interpretation None      MDM   Final  diagnoses:  Left lateral ankle pain    Filed Vitals:   06/07/14 1833 06/07/14 1928  BP: 172/128   Pulse: 100   Temp: 98 F (36.7 C)   TempSrc: Oral   Resp: 18   Height:  5\' 4"  (1.626 m)  Weight:  315 lb (142.883 kg)  SpO2: 99%     Medications  oxyCODONE-acetaminophen (PERCOCET/ROXICET) 5-325 MG per tablet 1 tablet (1 tablet Oral Given 06/07/14 1920)  lidocaine-EPINEPHrine (XYLOCAINE-EPINEPHrine) 1 %-1:200000 (PF) injection 20 mL (0 mLs Other Duplicate 06/07/14 1921)  lidocaine-EPINEPHrine (XYLOCAINE W/EPI) 2 %-1:100000 (with pres) injection (1 mL  Given by Other 06/07/14 1921)    Keiran Sias is a 33 y.o. female presenting with atraumatic left ankle swelling. Patient has no bony tenderness but significant swelling. This may be a joint effusion. Will attempt arthrocentesis after x-ray. X-ray with no bony abnormality, uric acid normal. Attempted arthrocentesis without return of fluid. Patient given crutches, Ace wrap and encouraged to follow with orthoepist  at her appointment in 3 days.  Evaluation does not show pathology that would require ongoing emergent intervention or inpatient treatment. Pt is hemodynamically stable and mentating appropriately. Discussed findings and plan with patient/guardian, who agrees with care plan. All questions answered. Return precautions discussed and outpatient follow up given.   Discharge Medication List as of 06/07/2014  8:21 PM    START taking these medications   Details  oxyCODONE-acetaminophen (PERCOCET/ROXICET) 5-325 MG per tablet 1 to 2 tabs PO q6hrs  PRN for pain, Print             Wynetta Emeryicole Rogina Schiano, PA-C 06/09/14 1027

## 2014-06-09 NOTE — ED Provider Notes (Signed)
Medical screening examination/treatment/procedure(s) were performed by non-physician practitioner and as supervising physician I was immediately available for consultation/collaboration.   EKG Interpretation None        Hawraa Stambaugh, MD 06/09/14 1623 

## 2014-06-10 ENCOUNTER — Ambulatory Visit (INDEPENDENT_AMBULATORY_CARE_PROVIDER_SITE_OTHER): Payer: PRIVATE HEALTH INSURANCE | Admitting: Family Medicine

## 2014-06-10 ENCOUNTER — Encounter: Payer: Self-pay | Admitting: Family Medicine

## 2014-06-10 VITALS — BP 195/159 | HR 106 | Ht 64.0 in | Wt 310.0 lb

## 2014-06-10 DIAGNOSIS — S99919A Unspecified injury of unspecified ankle, initial encounter: Secondary | ICD-10-CM

## 2014-06-10 DIAGNOSIS — S99929A Unspecified injury of unspecified foot, initial encounter: Secondary | ICD-10-CM

## 2014-06-10 DIAGNOSIS — S8990XA Unspecified injury of unspecified lower leg, initial encounter: Secondary | ICD-10-CM

## 2014-06-10 DIAGNOSIS — S99912A Unspecified injury of left ankle, initial encounter: Secondary | ICD-10-CM

## 2014-06-10 NOTE — Patient Instructions (Signed)
You have an ankle sprain. Ice the area for 15 minutes at a time, 3-4 times a day Aleve 2 tabs twice a day with food OR ibuprofen 3 tabs three times a day with food for pain and inflammation. Elevate above the level of your heart when possible Crutches if needed to help with walking Bear weight when tolerated Use boot to help with stability while you recover from this injury - wear every time you're up and walking around. Come out of the boot/brace twice a day to do Up/down and alphabet exercises 2-3 sets of each. Consider physical therapy for strengthening and balance exercises in the future. If not improving as expected, we may consider further testing like an MRI. Follow up with me in 2 weeks.

## 2014-06-11 ENCOUNTER — Encounter: Payer: Self-pay | Admitting: Family Medicine

## 2014-06-11 NOTE — Progress Notes (Signed)
Patient ID: Anita Montoya, female   DOB: [birth year], 33 y.o.   MRN: [id number]  PCP: Jeanann Lewandowsky, MD  Subjective:   HPI: Patient is a 33 y.o. female here for left ankle injury.  Patient reports on 7/10 she was getting up out of a chair when she accidentally inverted left ankle. + swelling laterally. Severe pain since then - up to 8/10 level. Has been elevating foot, using ASO. Radiographs negative for fracture. Attempted ankle joint aspiration unsuccessful in ED. No redness. No prior injuries.  Past Medical History  Diagnosis Date  . HLD (hyperlipidemia) 08/2013  . Menorrhagia 1989  . Duplicated left renal collecting system 2009    noted on 2009 CT abdomen and pelvis   . Hypertension 1995    Current Outpatient Prescriptions on File Prior to Visit  Medication Sig Dispense Refill  . amLODipine (NORVASC) 10 MG tablet Take 1 tablet (10 mg total) by mouth daily.  90 tablet  3  . atorvastatin (LIPITOR) 10 MG tablet Take 10 mg by mouth daily.      . cephALEXin (KEFLEX) 500 MG capsule Take 1 capsule (500 mg total) by mouth 4 (four) times daily.  28 capsule  0  . hydrochlorothiazide (HYDRODIURIL) 25 MG tablet Take 1 tablet (25 mg total) by mouth daily.  90 tablet  3  . medroxyPROGESTERone (PROVERA) 10 MG tablet Take 1 tablet (10 mg total) by mouth daily. One tab daily for five days starting on the first day of the month. Repeat monthly to regulate cycle.  30 tablet  0  . oxyCODONE-acetaminophen (PERCOCET/ROXICET) 5-325 MG per tablet 1 to 2 tabs PO q6hrs  PRN for pain  15 tablet  0  . phenazopyridine (PYRIDIUM) 200 MG tablet Take 1 tablet (200 mg total) by mouth 3 (three) times daily.  6 tablet  0   No current facility-administered medications on file prior to visit.    Past Surgical History  Procedure Laterality Date  . Gallbadder removal    . Uterine poly removed   2009    heavy menstrual bleeding   . Cholecystectomy      No Known Allergies  History   Social History   . Marital Status: Single    Spouse Name: N/A    Number of Children: N/A  . Years of Education: N/A   Occupational History  . Not on file.   Social History Main Topics  . Smoking status: Current Some Day Smoker -- 1.00 packs/day    Types: Cigarettes  . Smokeless tobacco: Never Used  . Alcohol Use: Yes     Comment: occasional  . Drug Use: No  . Sexual Activity: Yes    Birth Control/ Protection: None   Other Topics Concern  . Not on file   Social History Narrative  . No narrative on file    Family History  Problem Relation Age of Onset  . Diabetes Father   . Heart disease Brother     BP 195/159  Pulse 106  Ht 5\' 4"  (1.626 m)  Wt 310 lb (140.615 kg)  BMI 53.19 kg/m2  Review of Systems: See HPI above.    Objective:  Physical Exam:  Gen: NAD  Left ankle: Mod swelling.  No bruising other deformity. Mod limited ROM all directions TTP greatest over anterior ankle joint, ATFL.  Some lateral malleolus.  No other foot/ankle tenderness. 1+ ant drawer and talar tilt.   Negative syndesmotic compression. Thompsons test negative. NV intact distally.    Assessment &  Plan:  1. Left ankle injury - 2/2 sprain.  Radiographs negative.  Icing, nsaids, elevation, cam walker for support.  HEP reviewed.  F/u in 2 weeks.  Consider further imaging, PT, strengthening and balance exercises depending on her improvement.

## 2014-06-12 DIAGNOSIS — S99912A Unspecified injury of left ankle, initial encounter: Secondary | ICD-10-CM | POA: Insufficient documentation

## 2014-06-12 NOTE — Assessment & Plan Note (Signed)
2/2 sprain.  Radiographs negative.  Icing, nsaids, elevation, cam walker for support.  HEP reviewed.  F/u in 2 weeks.  Consider further imaging, PT, strengthening and balance exercises depending on her improvement.

## 2014-06-27 ENCOUNTER — Ambulatory Visit: Payer: PRIVATE HEALTH INSURANCE | Admitting: Family Medicine

## 2015-03-09 ENCOUNTER — Encounter (HOSPITAL_BASED_OUTPATIENT_CLINIC_OR_DEPARTMENT_OTHER): Payer: Self-pay | Admitting: *Deleted

## 2015-03-09 ENCOUNTER — Emergency Department (HOSPITAL_BASED_OUTPATIENT_CLINIC_OR_DEPARTMENT_OTHER): Payer: PRIVATE HEALTH INSURANCE

## 2015-03-09 ENCOUNTER — Emergency Department (HOSPITAL_BASED_OUTPATIENT_CLINIC_OR_DEPARTMENT_OTHER)
Admission: EM | Admit: 2015-03-09 | Discharge: 2015-03-10 | Disposition: A | Discharge status: Home / Self Care | Payer: PRIVATE HEALTH INSURANCE | Attending: Emergency Medicine | Admitting: Emergency Medicine

## 2015-03-09 DIAGNOSIS — I10 Essential (primary) hypertension: Secondary | ICD-10-CM | POA: Diagnosis not present

## 2015-03-09 DIAGNOSIS — Z792 Long term (current) use of antibiotics: Secondary | ICD-10-CM | POA: Insufficient documentation

## 2015-03-09 DIAGNOSIS — N939 Abnormal uterine and vaginal bleeding, unspecified: Secondary | ICD-10-CM

## 2015-03-09 DIAGNOSIS — Z3202 Encounter for pregnancy test, result negative: Secondary | ICD-10-CM | POA: Insufficient documentation

## 2015-03-09 DIAGNOSIS — N8320 Unspecified ovarian cysts: Secondary | ICD-10-CM | POA: Diagnosis not present

## 2015-03-09 DIAGNOSIS — E785 Hyperlipidemia, unspecified: Secondary | ICD-10-CM | POA: Diagnosis not present

## 2015-03-09 DIAGNOSIS — N83202 Unspecified ovarian cyst, left side: Secondary | ICD-10-CM

## 2015-03-09 DIAGNOSIS — N390 Urinary tract infection, site not specified: Secondary | ICD-10-CM | POA: Diagnosis not present

## 2015-03-09 DIAGNOSIS — Z9049 Acquired absence of other specified parts of digestive tract: Secondary | ICD-10-CM | POA: Insufficient documentation

## 2015-03-09 DIAGNOSIS — Z720 Tobacco use: Secondary | ICD-10-CM | POA: Insufficient documentation

## 2015-03-09 DIAGNOSIS — Z87718 Personal history of other specified (corrected) congenital malformations of genitourinary system: Secondary | ICD-10-CM | POA: Insufficient documentation

## 2015-03-09 DIAGNOSIS — Z79899 Other long term (current) drug therapy: Secondary | ICD-10-CM | POA: Diagnosis not present

## 2015-03-09 DIAGNOSIS — R1031 Right lower quadrant pain: Secondary | ICD-10-CM | POA: Diagnosis present

## 2015-03-09 DIAGNOSIS — R102 Pelvic and perineal pain: Secondary | ICD-10-CM

## 2015-03-09 LAB — URINE MICROSCOPIC-ADD ON

## 2015-03-09 LAB — COMPREHENSIVE METABOLIC PANEL
ALT: 19 U/L (ref 0–35)
AST: 14 U/L (ref 0–37)
Albumin: 3.5 g/dL (ref 3.5–5.2)
Alkaline Phosphatase: 93 U/L (ref 39–117)
Anion gap: 7 (ref 5–15)
BUN: 15 mg/dL (ref 6–23)
CALCIUM: 8.6 mg/dL (ref 8.4–10.5)
CO2: 25 mmol/L (ref 19–32)
Chloride: 106 mmol/L (ref 96–112)
Creatinine, Ser: 0.75 mg/dL (ref 0.50–1.10)
GFR calc Af Amer: >90 mL/min (ref >90)
GFR calc non Af Amer: >90 mL/min (ref >90)
Glucose, Bld: 91 mg/dL (ref 70–99)
POTASSIUM: 3.1 mmol/L — AB (ref 3.5–5.1)
SODIUM: 138 mmol/L (ref 135–145)
Total Bilirubin: 0.4 mg/dL (ref 0.3–1.2)
Total Protein: 7.4 g/dL (ref 6.0–8.3)

## 2015-03-09 LAB — CBC WITH DIFFERENTIAL/PLATELET
BASOS PCT: 0 % (ref 0–1)
Basophils Absolute: 0 10*3/uL (ref 0.0–0.1)
EOS ABS: 0.5 10*3/uL (ref 0.0–0.7)
EOS PCT: 4 % (ref 0–5)
HEMATOCRIT: 42.7 % (ref 36.0–46.0)
Hemoglobin: 14.3 g/dL (ref 12.0–15.0)
Lymphocytes Relative: 38 % (ref 12–46)
Lymphs Abs: 4.9 10*3/uL — ABNORMAL HIGH (ref 0.7–4.0)
MCH: 25.5 pg — AB (ref 26.0–34.0)
MCHC: 33.5 g/dL (ref 30.0–36.0)
MCV: 76.3 fL — AB (ref 78.0–100.0)
Monocytes Absolute: 1 10*3/uL (ref 0.1–1.0)
Monocytes Relative: 8 % (ref 3–12)
Neutro Abs: 6.5 10*3/uL (ref 1.7–7.7)
Neutrophils Relative %: 50 % (ref 43–77)
Platelets: 388 10*3/uL (ref 150–400)
RBC: 5.6 MIL/uL — AB (ref 3.87–5.11)
RDW: 14.9 % (ref 11.5–15.5)
WBC: 13.1 10*3/uL — AB (ref 4.0–10.5)

## 2015-03-09 LAB — URINALYSIS, ROUTINE W REFLEX MICROSCOPIC
Bilirubin Urine: NEGATIVE
GLUCOSE, UA: NEGATIVE mg/dL
KETONES UR: NEGATIVE mg/dL
Leukocytes, UA: NEGATIVE
Nitrite: NEGATIVE
Protein, ur: NEGATIVE mg/dL
SPECIFIC GRAVITY, URINE: 1.026 (ref 1.005–1.030)
Urobilinogen, UA: 1 mg/dL (ref 0.0–1.0)
pH: 6.5 (ref 5.0–8.0)

## 2015-03-09 LAB — PREGNANCY, URINE: Preg Test, Ur: NEGATIVE

## 2015-03-09 MED ORDER — SODIUM CHLORIDE 0.9 % IV BOLUS (SEPSIS)
1000.0000 mL | Freq: Once | INTRAVENOUS | Status: AC
Start: 2015-03-09 — End: 2015-03-10
  Administered 2015-03-09: 1000 mL via INTRAVENOUS

## 2015-03-09 NOTE — ED Provider Notes (Signed)
CSN: [id number]     Arrival date & time 03/09/15  1831 History   First MD Initiated Contact with Patient 03/09/15 2115     Chief Complaint  Patient presents with  . Urinary Tract Infection     (Consider location/radiation/quality/duration/timing/severity/associated sxs/prior Treatment) The history is provided by the patient. No language interpreter was used.  Anita Montoya is a 34 year old female with past medical history of hyperlipidemia, menorrhagia, hypertension with lower back pain and right lower quadrant pain that has been ongoing for approximately 3 weeks. Patient reports that the back pain started first followed by the right lower quadrant. Patient reports that the lower back pain is described as a throbbing sensation that is constant and at times does radiate to the right lower quadrant. Patient reports that the right lower quadrant pain as a cramping sensation. Stated that when she urinates she feels a pressure sensation. Stated that she is also been experiencing irregular vaginal bleeding. Patient reported that she has a long history of irregular menstrual cycles, but stated over the past 3 weeks she's been having intermittent bleeding, reported that some day she will bleed the other day she won't on and off intermittently. Patient reported that she is not currently on any form of birth control. Stated that she is sexually active, but does not use protection. Denied pelvic pain, vaginal discharge, vaginal pain, dysuria, numbness, tingling, loss of sensation, extremity pain, headache, dizziness, fainting, nausea, vomiting, diarrhea, melena, hematochezia, urinary bowel incontinence, fall, injury, chest pain, shortness of breath, difficulty breathing, hemoptysis, leg swelling. PCP Dr. Hyman Hopes  Past Medical History  Diagnosis Date  . HLD (hyperlipidemia) 08/2013  . Menorrhagia 1989  . Duplicated left renal collecting system 2009    noted on 2009 CT abdomen and pelvis   . Hypertension 1995    Past Surgical History  Procedure Laterality Date  . Gallbadder removal    . Uterine poly removed   2009    heavy menstrual bleeding   . Cholecystectomy     Family History  Problem Relation Age of Onset  . Diabetes Father   . Heart disease Brother    History  Substance Use Topics  . Smoking status: Current Some Day Smoker -- 1.00 packs/day    Types: Cigarettes  . Smokeless tobacco: Never Used  . Alcohol Use: Yes     Comment: occasional   OB History    Gravida Para Term Preterm AB TAB SAB Ectopic Multiple Living   0 0             Review of Systems  Constitutional: Negative for fever and chills.  Eyes: Negative for visual disturbance.  Respiratory: Negative for chest tightness and shortness of breath.   Cardiovascular: Negative for chest pain and leg swelling.  Gastrointestinal: Positive for abdominal pain. Negative for nausea, vomiting, diarrhea, constipation, blood in stool and anal bleeding.  Genitourinary: Positive for hematuria and vaginal bleeding. Negative for dysuria, vaginal discharge, vaginal pain and pelvic pain.  Musculoskeletal: Positive for back pain. Negative for neck pain and neck stiffness.  Neurological: Negative for dizziness, weakness, numbness and headaches.      Allergies  Review of patient's allergies indicates no known allergies.  Home Medications   Prior to Admission medications   Medication Sig Start Date End Date Taking? Authorizing Provider  lisinopril (PRINIVIL,ZESTRIL) 40 MG tablet Take 40 mg by mouth 2 (two) times daily.   Yes Historical Provider, MD  amLODipine (NORVASC) 10 MG tablet Take 1 tablet (10 mg total) by  mouth daily. 12/15/13   Josalyn Funches, MD  atorvastatin (LIPITOR) 10 MG tablet Take 10 mg by mouth daily.    Historical Provider, MD  cephALEXin (KEFLEX) 500 MG capsule Take 1 capsule (500 mg total) by mouth 4 (four) times daily. 03/31/14   Elpidio Anis, PA-C  hydrochlorothiazide (HYDRODIURIL) 25 MG tablet Take 1 tablet (25  mg total) by mouth daily. 12/15/13   Josalyn Funches, MD  medroxyPROGESTERone (PROVERA) 10 MG tablet Take 1 tablet (10 mg total) by mouth daily. One tab daily for five days starting on the first day of the month. Repeat monthly to regulate cycle. 12/15/13   Dessa Phi, MD  oxyCODONE-acetaminophen (PERCOCET/ROXICET) 5-325 MG per tablet 1 to 2 tabs PO q6hrs  PRN for pain 06/07/14   Joni Reining Pisciotta, PA-C  phenazopyridine (PYRIDIUM) 200 MG tablet Take 1 tablet (200 mg total) by mouth 3 (three) times daily. 03/31/14   Shari Upstill, PA-C   BP 132/85 mmHg  Pulse 75  Temp(Src) 97.5 F (36.4 C) (Oral)  Resp 16  Ht 5\' 4"  (1.626 m)  Wt 300 lb (136.079 kg)  BMI 51.47 kg/m2  SpO2 100%  LMP 11/15/2014 Physical Exam  Constitutional: She is oriented to person, place, and time. She appears well-developed and well-nourished. No distress.  HENT:  Head: Normocephalic and atraumatic.  Mouth/Throat: Oropharynx is clear and moist. No oropharyngeal exudate.  Eyes: Conjunctivae and EOM are normal. Pupils are equal, round, and reactive to light. Right eye exhibits no discharge. Left eye exhibits no discharge.  Neck: Normal range of motion. Neck supple.  Cardiovascular: Normal rate, regular rhythm and normal heart sounds.  Exam reveals no friction rub.   No murmur heard. Pulmonary/Chest: Effort normal and breath sounds normal. No respiratory distress. She has no wheezes. She has no rales.  Abdominal: Soft. Bowel sounds are normal. She exhibits no distension. There is tenderness in the right lower quadrant. There is no rebound, no guarding and no CVA tenderness.  Obese   Genitourinary:  Pelvic exam: Negative swelling, erythema, inflammation, lesions, sores, deformities identified to the external genitalia. Negative areas of fluctuance or induration noted. Negative blood in the vaginal vault. Cervix identified with negative friability. Very minimal amount of blood from the cervix. Thick yellowish discharge  identified. Negative for very appearance of the cervix noted. Negative CMT, mild right adnexal tenderness noted. Negative left adnexal tenderness noted. Exam chaperoned with nurse.  Musculoskeletal: Normal range of motion.  Negative deformities identified to the spine Mild discomfort upon palpation to the lumbosacral region   Full range of motion to upper and lower extremities bilaterally without difficulty or ataxia.  Neurological: She is alert and oriented to person, place, and time. No cranial nerve deficit. She exhibits normal muscle tone. Coordination normal.  Cranial nerves III-XII grossly intact Strength 5+/5+ to upper and lower extremities bilaterally with resistance applied, equal distribution noted Equal grip strength bilaterally Negative arm drift Fine motor skills intact Heel to knee down shin normal bilaterally Gait proper, proper balance - negative sway, negative drift, negative step-offs  Skin: Skin is warm and dry. No rash noted. She is not diaphoretic. No erythema.  Psychiatric: She has a normal mood and affect. Her behavior is normal. Thought content normal.  Nursing note and vitals reviewed.   ED Course  Procedures (including critical care time)  Results for orders placed or performed during the hospital encounter of 03/09/15  Wet prep, genital  Result Value Ref Range   Yeast Wet Prep HPF POC NONE SEEN NONE  SEEN   Trich, Wet Prep NONE SEEN NONE SEEN   Clue Cells Wet Prep HPF POC FEW (A) NONE SEEN   WBC, Wet Prep HPF POC FEW (A) NONE SEEN  Urinalysis, Routine w reflex microscopic  Result Value Ref Range   Color, Urine YELLOW YELLOW   APPearance CLEAR CLEAR   Specific Gravity, Urine 1.026 1.005 - 1.030   pH 6.5 5.0 - 8.0   Glucose, UA NEGATIVE NEGATIVE mg/dL   Hgb urine dipstick LARGE (A) NEGATIVE   Bilirubin Urine NEGATIVE NEGATIVE   Ketones, ur NEGATIVE NEGATIVE mg/dL   Protein, ur NEGATIVE NEGATIVE mg/dL   Urobilinogen, UA 1.0 0.0 - 1.0 mg/dL   Nitrite  NEGATIVE NEGATIVE   Leukocytes, UA NEGATIVE NEGATIVE  Pregnancy, urine  Result Value Ref Range   Preg Test, Ur NEGATIVE NEGATIVE  Urine microscopic-add on  Result Value Ref Range   Squamous Epithelial / LPF RARE RARE   WBC, UA 0-2 <3 WBC/hpf   RBC / HPF 0-2 <3 RBC/hpf   Bacteria, UA RARE RARE  CBC with Differential/Platelet  Result Value Ref Range   WBC 13.1 (H) 4.0 - 10.5 K/uL   RBC 5.60 (H) 3.87 - 5.11 MIL/uL   Hemoglobin 14.3 12.0 - 15.0 g/dL   HCT 16.1 09.6 - 04.5 %   MCV 76.3 (L) 78.0 - 100.0 fL   MCH 25.5 (L) 26.0 - 34.0 pg   MCHC 33.5 30.0 - 36.0 g/dL   RDW 40.9 81.1 - 91.4 %   Platelets 388 150 - 400 K/uL   Neutrophils Relative % 50 43 - 77 %   Neutro Abs 6.5 1.7 - 7.7 K/uL   Lymphocytes Relative 38 12 - 46 %   Lymphs Abs 4.9 (H) 0.7 - 4.0 K/uL   Monocytes Relative 8 3 - 12 %   Monocytes Absolute 1.0 0.1 - 1.0 K/uL   Eosinophils Relative 4 0 - 5 %   Eosinophils Absolute 0.5 0.0 - 0.7 K/uL   Basophils Relative 0 0 - 1 %   Basophils Absolute 0.0 0.0 - 0.1 K/uL  Comprehensive metabolic panel  Result Value Ref Range   Sodium 138 135 - 145 mmol/L   Potassium 3.1 (L) 3.5 - 5.1 mmol/L   Chloride 106 96 - 112 mmol/L   CO2 25 19 - 32 mmol/L   Glucose, Bld 91 70 - 99 mg/dL   BUN 15 6 - 23 mg/dL   Creatinine, Ser 7.82 0.50 - 1.10 mg/dL   Calcium 8.6 8.4 - 95.6 mg/dL   Total Protein 7.4 6.0 - 8.3 g/dL   Albumin 3.5 3.5 - 5.2 g/dL   AST 14 0 - 37 U/L   ALT 19 0 - 35 U/L   Alkaline Phosphatase 93 39 - 117 U/L   Total Bilirubin 0.4 0.3 - 1.2 mg/dL   GFR calc non Af Amer >90 >90 mL/min   GFR calc Af Amer >90 >90 mL/min   Anion gap 7 5 - 15    Labs Review Labs Reviewed  WET PREP, GENITAL - Abnormal; Notable for the following:    Clue Cells Wet Prep HPF POC FEW (*)    WBC, Wet Prep HPF POC FEW (*)    All other components within normal limits  URINALYSIS, ROUTINE W REFLEX MICROSCOPIC - Abnormal; Notable for the following:    Hgb urine dipstick LARGE (*)    All other  components within normal limits  CBC WITH DIFFERENTIAL/PLATELET - Abnormal; Notable for the following:  WBC 13.1 (*)    RBC 5.60 (*)    MCV 76.3 (*)    MCH 25.5 (*)    Lymphs Abs 4.9 (*)    All other components within normal limits  COMPREHENSIVE METABOLIC PANEL - Abnormal; Notable for the following:    Potassium 3.1 (*)    All other components within normal limits  PREGNANCY, URINE  URINE MICROSCOPIC-ADD ON  GC/CHLAMYDIA PROBE AMP ([place])    Imaging Review US Transvaginal Non-ob  03/09/2015   CLINICAL DATA:  34 year old female with 3 week history of bilateral lower back and right lower quadrant pain accompanied by hematuria today.  EXAM: TRANSABDOMINAL AND TRANSVAGINAL ULTRASOUND OF PELVIS  DOPPLER ULTRASOUND OF OVARIES  TECHNIQUE: Both transabdominal and transvaginal ultrasound examinations of the pelvis were performed. Transabdominal technique was performed for global imaging of the pelvis including uterus, ovaries, adnexal regions, and pelvic cul-de-sac.  It was necessary to proceed with endovaginal exam following the transabdominal exam to visualize the Adnexum bilaterally. Color and duplex Doppler ultrasound was utilized to evaluate blood flow to the ovaries.  COMPARISON:  CT abdomen/ pelvis 10/09/2014  FINDINGS: Uterus  Measurements: 5.7 x 3.1 x 4.1 cm. No fibroids or other mass visualized. Incidental note is made of nabothian cysts in the uterine cervix.  Endometrium  Thickness: 6 mm.  No focal abnormality visualized.  Right ovary  Measurements: 3.1 x 1.9 x 2.0 cm. Normal appearance/no adnexal mass.  Left ovary  Measurements: 4.7 x 2.7 x 3.2 cm. Complex hypoechoic cyst with multiple thin internal septations versus several adjacent and abutting simple cysts. Measured in aggregate, the area measures 4.4 x 1.9 x 3.0 cm.  Pulsed Doppler evaluation of both ovaries demonstrates normal low-resistance arterial and venous waveforms.  Other findings  No free fluid.  IMPRESSION: 1. Negative  for evidence of ovarian torsion at this time. 2. Mildly complex 4.4 cm cyst with multiple thin internal septations versus several adjacent and abutting follicular cysts within the left ovary. While very likely benign, given the mild complexity followup imaging is recommended in 3 months time.   Electronically Signed   By: Malachy Moan M.D.   On: 03/09/2015 23:36   US Pelvis Complete  03/09/2015   CLINICAL DATA:  34 year old female with 3 week history of bilateral lower back and right lower quadrant pain accompanied by hematuria today.  EXAM: TRANSABDOMINAL AND TRANSVAGINAL ULTRASOUND OF PELVIS  DOPPLER ULTRASOUND OF OVARIES  TECHNIQUE: Both transabdominal and transvaginal ultrasound examinations of the pelvis were performed. Transabdominal technique was performed for global imaging of the pelvis including uterus, ovaries, adnexal regions, and pelvic cul-de-sac.  It was necessary to proceed with endovaginal exam following the transabdominal exam to visualize the Adnexum bilaterally. Color and duplex Doppler ultrasound was utilized to evaluate blood flow to the ovaries.  COMPARISON:  CT abdomen/ pelvis 10/09/2014  FINDINGS: Uterus  Measurements: 5.7 x 3.1 x 4.1 cm. No fibroids or other mass visualized. Incidental note is made of nabothian cysts in the uterine cervix.  Endometrium  Thickness: 6 mm.  No focal abnormality visualized.  Right ovary  Measurements: 3.1 x 1.9 x 2.0 cm. Normal appearance/no adnexal mass.  Left ovary  Measurements: 4.7 x 2.7 x 3.2 cm. Complex hypoechoic cyst with multiple thin internal septations versus several adjacent and abutting simple cysts. Measured in aggregate, the area measures 4.4 x 1.9 x 3.0 cm.  Pulsed Doppler evaluation of both ovaries demonstrates normal low-resistance arterial and venous waveforms.  Other findings  No free fluid.  IMPRESSION: 1. Negative for evidence of ovarian torsion at this time. 2. Mildly complex 4.4 cm cyst with multiple thin internal septations  versus several adjacent and abutting follicular cysts within the left ovary. While very likely benign, given the mild complexity followup imaging is recommended in 3 months time.   Electronically Signed   By: Malachy Moan M.D.   On: 03/09/2015 23:36   [place] Art/ven Flow Abd Pelv Doppler  03/09/2015   CLINICAL DATA:  34 year old female with 3 week history of bilateral lower back and right lower quadrant pain accompanied by hematuria today.  EXAM: TRANSABDOMINAL AND TRANSVAGINAL ULTRASOUND OF PELVIS  DOPPLER ULTRASOUND OF OVARIES  TECHNIQUE: Both transabdominal and transvaginal ultrasound examinations of the pelvis were performed. Transabdominal technique was performed for global imaging of the pelvis including uterus, ovaries, adnexal regions, and pelvic cul-de-sac.  It was necessary to proceed with endovaginal exam following the transabdominal exam to visualize the Adnexum bilaterally. Color and duplex Doppler ultrasound was utilized to evaluate blood flow to the ovaries.  COMPARISON:  CT abdomen/ pelvis 10/09/2014  FINDINGS: Uterus  Measurements: 5.7 x 3.1 x 4.1 cm. No fibroids or other mass visualized. Incidental note is made of nabothian cysts in the uterine cervix.  Endometrium  Thickness: 6 mm.  No focal abnormality visualized.  Right ovary  Measurements: 3.1 x 1.9 x 2.0 cm. Normal appearance/no adnexal mass.  Left ovary  Measurements: 4.7 x 2.7 x 3.2 cm. Complex hypoechoic cyst with multiple thin internal septations versus several adjacent and abutting simple cysts. Measured in aggregate, the area measures 4.4 x 1.9 x 3.0 cm.  Pulsed Doppler evaluation of both ovaries demonstrates normal low-resistance arterial and venous waveforms.  Other findings  No free fluid.  IMPRESSION: 1. Negative for evidence of ovarian torsion at this time. 2. Mildly complex 4.4 cm cyst with multiple thin internal septations versus several adjacent and abutting follicular cysts within the left ovary. While very likely benign,  given the mild complexity followup imaging is recommended in 3 months time.   Electronically Signed   By: Malachy Moan M.D.   On: 03/09/2015 23:36     EKG Interpretation None      MDM   Final diagnoses:  Pelvic pain in female  Abnormal uterine bleeding  RLQ abdominal pain  Left ovarian cyst    Medications  potassium chloride SA (K-DUR,KLOR-CON) CR tablet 40 mEq (not administered)  sodium chloride 0.9 % bolus 1,000 mL (0 mLs Intravenous Stopped 03/10/15 0132)    Filed Vitals:   03/09/15 1856 03/10/15 0100 03/10/15 0137  BP: 114/79 155/93 132/85  Pulse: 90 76 75  Temp: 98.2 F (36.8 C)  97.5 F (36.4 C)  TempSrc: Oral  Oral  Resp: 22  16  Height:  (1.626 m)    Weight: 300 lb (136.079 kg)    SpO2: 100% 98% 100%   Mildly elevated white blood cell count of 13.1. Hemoglobin 14.3, hematocrit 42.7. CMP noted mildly low potassium of 3.1. BUN/creatinine within normal limits-BUN 15, creatinine 0.75. Glucose 91 with negative elevated anion gap. Urinalysis noted large hemoglobin with negative nitrites, leukocytes-negative findings of infection. Negative ketones noted in urine. Urine pregnancy negative. Wet prep negative for yeast, Trichomonas-few clue cells and a few white blood cells noted. Pelvic ultrasound negative for evidence of ovarian torsion. Mildly complex 4.4 cm cyst with multiple thin internal septations versus several adjacent and abutting follicular cysts left ovary-very likely benign-recommend follow-up ultrasound in 3 months. Pelvic exam noted some discomfort in  the right adnexal - abnormal uterine bleeding noted-patient's history of irregular menstrual cycles. Doubt PID - negative trich, negative CMT, US unremarkable. Negative findings of ovarian torsion. Left ovarian cyst identified with negative abnormalities. Negative findings of UTI or pyelonephritis. CT abdomen pelvis with contrast pending. Discussed case in great detail with Dr. Nicanor AlconPalumbo, transfer of care to Dr.  Nicanor AlconPalumbo at change in shift.  AGCO CorporationMarissa Levoy Geisen, PA-C 03/10/15 0246  Gwyneth SproutWhitney Plunkett, MD 03/12/15 [phone]

## 2015-03-09 NOTE — ED Notes (Signed)
Pt in ultrasound

## 2015-03-09 NOTE — ED Notes (Signed)
Lower back pain, right lower quadrant pain.  Urinary frequency and urgency and pain and bleeding with urination.  No GYn symptoms

## 2015-03-10 ENCOUNTER — Emergency Department (HOSPITAL_BASED_OUTPATIENT_CLINIC_OR_DEPARTMENT_OTHER): Payer: PRIVATE HEALTH INSURANCE

## 2015-03-10 DIAGNOSIS — R1031 Right lower quadrant pain: Secondary | ICD-10-CM | POA: Diagnosis present

## 2015-03-10 LAB — WET PREP, GENITAL
Trich, Wet Prep: NONE SEEN
Yeast Wet Prep HPF POC: NONE SEEN

## 2015-03-10 MED ORDER — POTASSIUM CHLORIDE CRYS ER 20 MEQ PO TBCR
40.0000 meq | EXTENDED_RELEASE_TABLET | Freq: Once | ORAL | Status: AC
Start: 2015-03-10 — End: 2015-03-10
  Administered 2015-03-10: 40 meq via ORAL
  Filled 2015-03-10: qty 2

## 2015-03-10 MED ORDER — MELOXICAM 7.5 MG PO TABS: 7.5000 mg | ORAL_TABLET | Freq: Every day | ORAL | Status: DC

## 2015-03-10 NOTE — ED Notes (Signed)
Ass[place] care of patient from [place], [place]. Pt lying on stretcher resting quietly at this time. NO distress. Denies pain. No shortness of breath. Stretcher locked and in lowest position. Pt awaiting disposition from EDP.

## 2015-03-10 NOTE — ED Notes (Signed)
Pt in ct 

## 2015-03-10 NOTE — Discharge Instructions (Signed)
Please call your doctor for a followup appointment within 24-48 hours. When you talk to your doctor please let them know that you were seen in the emergency department and have them acquire all of your records so that they can discuss the findings with you and formulate a treatment plan to fully care for your new and ongoing problems. Please follow-up with your primary care provider Please follow-up with OB/GYN-will need repeat ultrasound in approximately 3 months regarding cyst on the left ovary to closely monitor for change in size Please have potassium reassessed within approximately 24-48 hours Please rest and stay hydrated Please drink plenty of fluids Please continue to monitor symptoms closely and if symptoms are to worsen or change (fever greater than 101, chills, sweating, nausea, vomiting, chest pain, shortness of breathe, difficulty breathing, weakness, numbness, tingling, worsening or changes to pain pattern, inability to control urine and bowel movements, numbness, tingling, loss of sensation to legs, inability to food or fluids down, worsening or changes to stomach pain, blood in stools, black tarry stools) please report back to the Emergency Department immediately.

## 2015-03-11 LAB — GC/CHLAMYDIA PROBE AMP (~~LOC~~) NOT AT ARMC
Chlamydia: NEGATIVE
Neisseria Gonorrhea: NEGATIVE

## 2015-03-19 ENCOUNTER — Encounter: Payer: Self-pay | Admitting: Family Medicine

## 2015-03-19 ENCOUNTER — Ambulatory Visit: Payer: PRIVATE HEALTH INSURANCE | Attending: Family Medicine | Admitting: Family Medicine

## 2015-03-19 VITALS — BP 162/119 | HR 91 | Temp 98.4°F | Resp 16 | Ht 64.0 in | Wt 297.0 lb

## 2015-03-19 DIAGNOSIS — N8320 Unspecified ovarian cysts: Secondary | ICD-10-CM | POA: Insufficient documentation

## 2015-03-19 DIAGNOSIS — I10 Essential (primary) hypertension: Secondary | ICD-10-CM | POA: Insufficient documentation

## 2015-03-19 DIAGNOSIS — R1031 Right lower quadrant pain: Secondary | ICD-10-CM

## 2015-03-19 DIAGNOSIS — M545 Low back pain, unspecified: Secondary | ICD-10-CM

## 2015-03-19 DIAGNOSIS — E282 Polycystic ovarian syndrome: Secondary | ICD-10-CM | POA: Diagnosis not present

## 2015-03-19 DIAGNOSIS — N921 Excessive and frequent menstruation with irregular cycle: Secondary | ICD-10-CM | POA: Diagnosis not present

## 2015-03-19 LAB — POCT GLYCOSYLATED HEMOGLOBIN (HGB A1C): Hemoglobin A1C: 5.9

## 2015-03-19 MED ORDER — CHLORTHALIDONE 25 MG PO TABS: 25.0000 mg | ORAL_TABLET | Freq: Every day | ORAL | Status: DC

## 2015-03-19 MED ORDER — LISINOPRIL 40 MG PO TABS: 40.0000 mg | ORAL_TABLET | Freq: Every day | ORAL | Status: DC

## 2015-03-19 MED ORDER — KETOROLAC TROMETHAMINE 60 MG/2ML IM SOLN
60.0000 mg | Freq: Once | INTRAMUSCULAR | Status: AC
  Administered 2015-03-19: 60 mg via INTRAMUSCULAR

## 2015-03-19 NOTE — Progress Notes (Signed)
Establish Care ED F/U cyst on ovary  Irregular menses

## 2015-03-19 NOTE — Patient Instructions (Addendum)
[name],  Thank you for coming in today.  1. HTN: goal BP is < 140/90 at all times to avoid kidney failure, heart attack, blindness and stroke.  Plan lisinopril 40 mg once daily  Start chlorthalidone 25 mg once daily    2. Pelvic pain and back pain: I suspect musculoskeletal pain related to being overweight  Weight loss needed, nutrition referral placed today  Toradol 60 mg IM today mobic once daily as needed with food   3. L ovarian cyst, spaced out periods, facial hair, elevated A1c, but not diabetes range (diabetes is A1c 6.5 or greater) - I suspect PCOS Gynecology referral placed today   F/u in 3 weeks with RN for BP check F/u with me in 3 months for HTN  Dr. Armen PickupFunches   Polycystic Ovarian Syndrome Polycystic ovarian syndrome (PCOS) is a common hormonal disorder among women of reproductive age. Most women with PCOS grow many small cysts on their ovaries. PCOS can cause problems with your periods and make it difficult to get pregnant. It can also cause an increased risk of miscarriage with pregnancy. If left untreated, PCOS can lead to serious health problems, such as diabetes and heart disease. CAUSES The cause of PCOS is not fully understood, but genetics may be a factor. SIGNS AND SYMPTOMS   Infrequent or no menstrual periods.   Inability to get pregnant (infertility) because of not ovulating.   Increased growth of hair on the face, chest, stomach, back, thumbs, thighs, or toes.   Acne, oily skin, or dandruff.   Pelvic pain.   Weight gain or obesity, usually carrying extra weight around the waist.   Type 2 diabetes.   High cholesterol.   High blood pressure.   Female-pattern baldness or thinning hair.   Patches of thickened and dark brown or black skin on the neck, arms, breasts, or thighs.   Tiny excess flaps of skin (skin tags) in the armpits or neck area.   Excessive snoring and having breathing stop at times while asleep (sleep apnea).    Deepening of the voice.   Gestational diabetes when pregnant.  DIAGNOSIS  There is no single test to diagnose PCOS.   Your health care provider will:   Take a medical history.   Perform a pelvic exam.   Have ultrasonography done.   Check your female and female hormone levels.   Measure glucose or sugar levels in the blood.   Do other blood tests.   If you are producing too many female hormones, your health care provider will make sure it is from PCOS. At the physical exam, your health care provider will want to evaluate the areas of increased hair growth. Try to allow natural hair growth for a few days before the visit.   During a pelvic exam, the ovaries may be enlarged or swollen because of the increased number of small cysts. This can be seen more easily by using vaginal ultrasonography or screening to examine the ovaries and lining of the uterus (endometrium) for cysts. The uterine lining may become thicker if you have not been having a regular period.  TREATMENT  Because there is no cure for PCOS, it needs to be managed to prevent problems. Treatments are based on your symptoms. Treatment is also based on whether you want to have a baby or whether you need contraception.  Treatment may include:   Progesterone hormone to start a menstrual period.   Birth control pills to make you have regular menstrual periods.  Medicines to make you ovulate, if you want to get pregnant.   Medicines to control your insulin.   Medicine to control your blood pressure.   Medicine and diet to control your high cholesterol and triglycerides in your blood.  Medicine to reduce excessive hair growth.  Surgery, making small holes in the ovary, to decrease the amount of female hormone production. This is done through a long, lighted tube (laparoscope) placed into the pelvis through a tiny incision in the lower abdomen.  HOME CARE INSTRUCTIONS  Only take over-the-counter or  prescription medicine as directed by your health care provider.  Pay attention to the foods you eat and your activity levels. This can help reduce the effects of PCOS.  Keep your weight under control.  Eat foods that are low in carbohydrate and high in fiber.  Exercise regularly. SEEK MEDICAL CARE IF:  Your symptoms do not get better with medicine.  You have new symptoms. Document Released: 02/25/2005 Document Revised: 08/22/2013 Document Reviewed: 04/19/2013 [facility] Patient Information 2015 [place], [place]. This information is not intended to replace advice given to you by your health care provider. Make sure you discuss any questions you have with your health care provider.

## 2015-03-19 NOTE — Progress Notes (Signed)
   Subjective:    Patient ID: Anita Montoya, female    DOB: July 02, 1981, 34 y.o.   MRN: [id number] CC:  Ed f/u ovarian cyst, HTN  HPI  1. CHRONIC HYPERTENSION  Disease Monitoring  Blood pressure range: not checking   Chest pain: no   Dyspnea: no   Claudication: no   Medication compliance: no  Medication Side Effects  Lightheadedness: no   Urinary frequency: no   Edema: no      Preventitive Healthcare:  Exercise: no    2. Back pain: having L low back pain and R lower abdominal pain. Worse with movement and prolonged standing. Went to ED 03/10/15, normal CT abdomen. pelvic US with L ovarian cyst. Patient has hx of oligomenorrhea   Soc Hx: current smoker  Review of Systems  Constitutional: Negative for fever and chills.  Genitourinary: Positive for menstrual problem.  Musculoskeletal: Positive for back pain.       Objective:   Physical Exam BP 162/119 mmHg  Pulse 91  Temp(Src) 98.4 F (36.9 C) (Oral)  Resp 16  Ht 5\' 4"  (1.626 m)  Wt 297 lb (134.718 kg)  BMI 50.95 kg/m2  SpO2 100%  LMP 11/15/2014  Wt Readings from Last 3 Encounters:  03/19/15 297 lb (134.718 kg)  03/09/15 300 lb (136.079 kg)  06/10/14 310 lb (140.615 kg)    BP Readings from Last 3 Encounters:  03/19/15 162/119  03/10/15 134/84  06/10/14 195/159  General appearance: alert, cooperative and no distress Lungs: clear to auscultation bilaterally Heart: regular rate and rhythm, S1, S2 normal, no murmur, click, rub or gallop Abdomen: soft, non-tender; bowel sounds normal; no masses,  no organomegaly  Back: no deformity, normal gait,   Lab Results  Component Value Date   HGBA1C 5.90 03/19/2015         Assessment & Plan:

## 2015-03-21 NOTE — Assessment & Plan Note (Signed)
HTN: goal BP is < 140/90 at all times to avoid kidney failure, heart attack, blindness and stroke.  Plan lisinopril 40 mg once daily  Start chlorthalidone 25 mg once daily

## 2015-03-21 NOTE — Assessment & Plan Note (Signed)
Pelvic pain and back pain: I suspect musculoskeletal pain related to being overweight  Weight loss needed, nutrition referral placed today  Toradol 60 mg IM today mobic once daily as needed with food

## 2015-03-21 NOTE — Assessment & Plan Note (Signed)
Pelvic pain and back pain: I suspect musculoskeletal pain related to being overweight  Weight loss needed, nutrition referral placed today  Toradol 60 mg IM today mobic once daily as needed with food      

## 2015-03-21 NOTE — Assessment & Plan Note (Signed)
L ovarian cyst, spaced out periods, facial hair, elevated A1c, but not diabetes range (diabetes is A1c 6.5 or greater) - I suspect PCOS Gynecology referral placed today

## 2015-04-17 ENCOUNTER — Ambulatory Visit: Payer: PRIVATE HEALTH INSURANCE | Admitting: Dietician

## 2015-09-23 IMAGING — CT CT ABD-PELV W/ CM
2 of 5 series · 17 of 46 positions shown, 19 images · IV contrast (APPLIED)
Comparison: CT of the abdomen and pelvis performed 10/09/2014, and
pelvic ultrasound performed 03/09/2015

CLINICAL DATA: Acute onset of right lower quadrant abdominal pain
and lower back pain. Increased urinary urgency and frequency.
Dysuria and hematuria. Initial encounter.

EXAM:
CT ABDOMEN AND PELVIS WITH CONTRAST
TECHNIQUE: Multidetector CT imaging of the abdomen and pelvis was performed
using the standard protocol following bolus administration of
intravenous contrast.
CONTRAST:  100 mL of Omnipaque 300 IV contrast

[Series 2: abd/pelvis 5.0 b31f · axial · 0.98mm/px · z∈[-476,-26]mm · 14 of 100 slices shown, 16 images]
[im 5/100  soft-tissue]
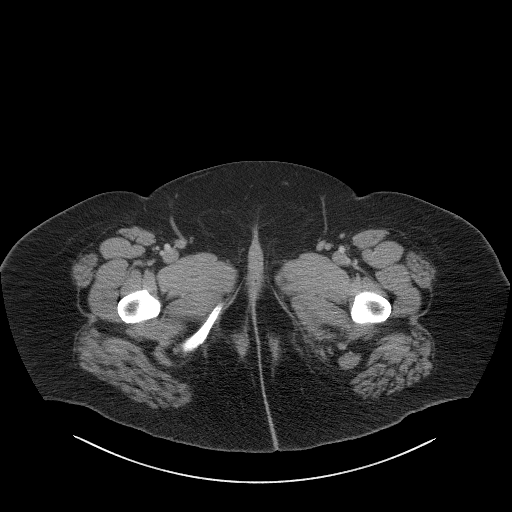
[im 5/100  bone]
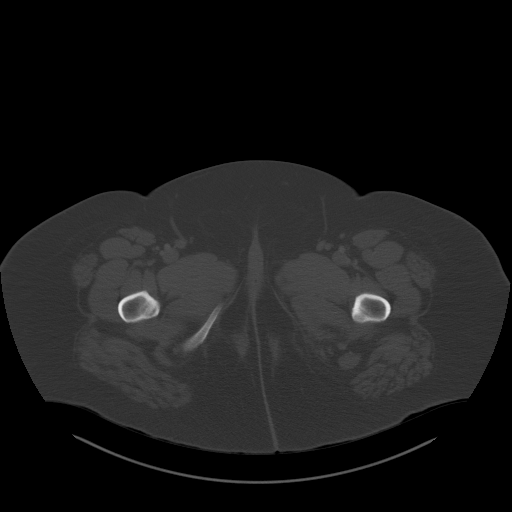
[im 15/100  soft-tissue]
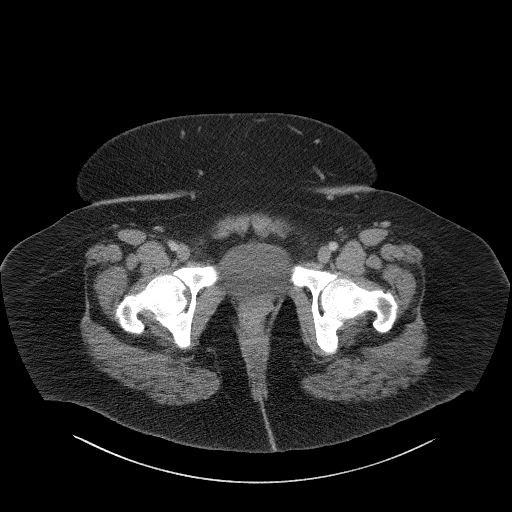
[im 20/100  soft-tissue]
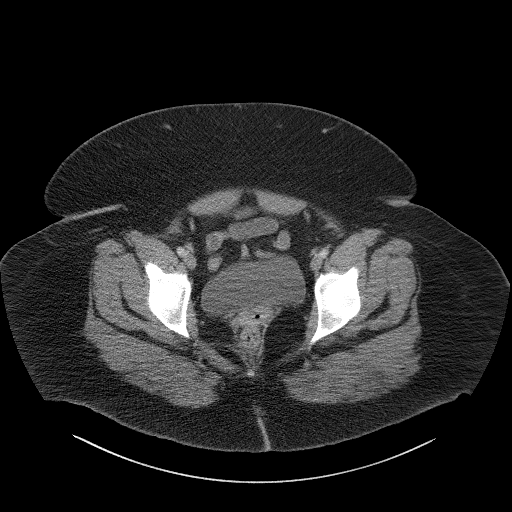
[im 25/100  soft-tissue]
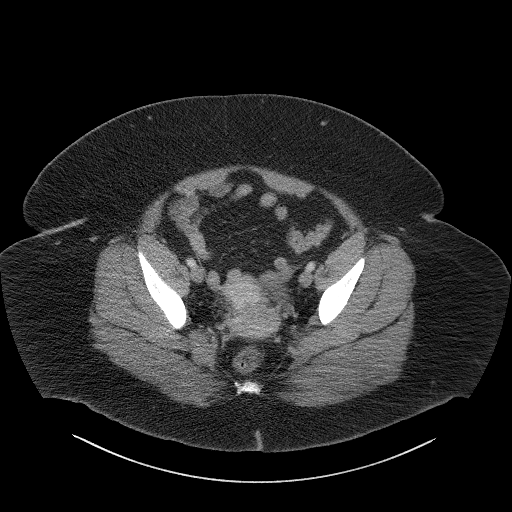
[im 35/100  soft-tissue]
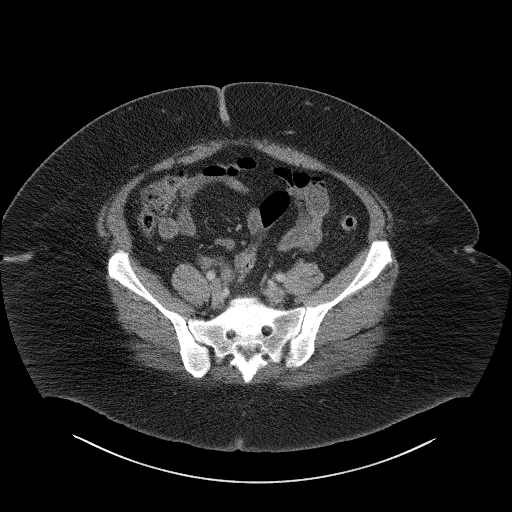
[im 40/100  soft-tissue]
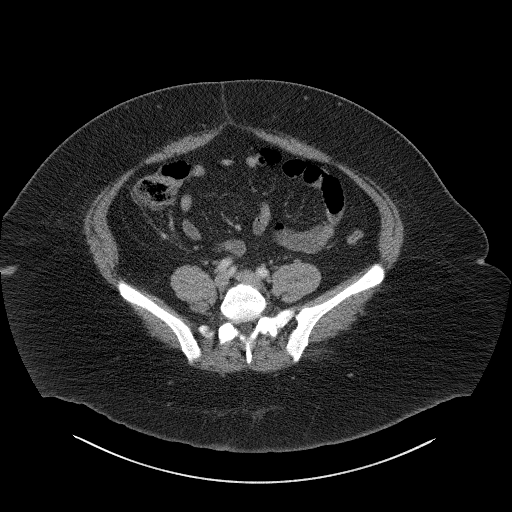
[im 45/100  soft-tissue]
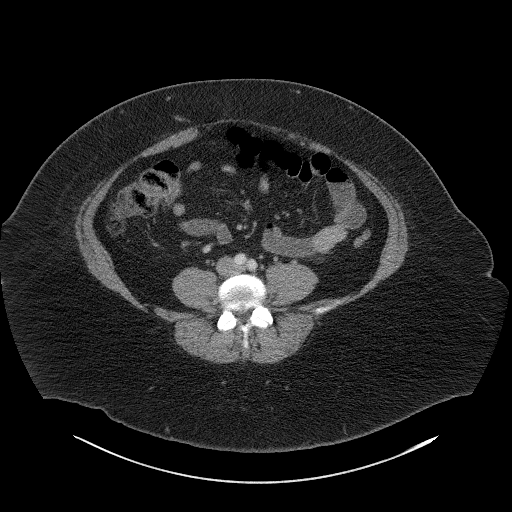
[im 55/100  soft-tissue]
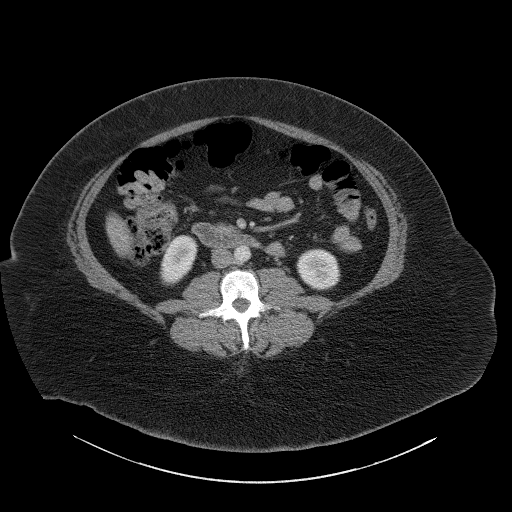
[im 60/100  soft-tissue]
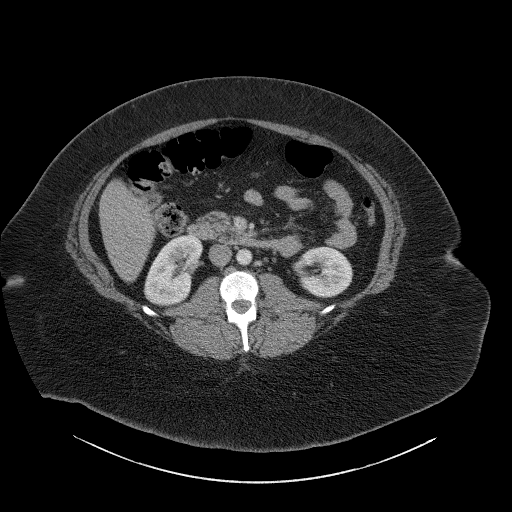
[im 60/100  bone]
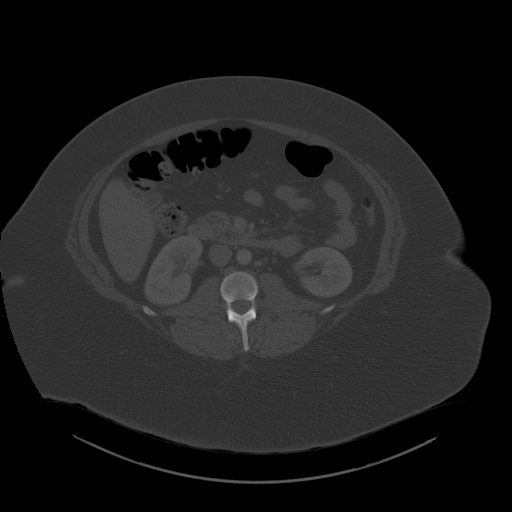
[im 65/100  soft-tissue]
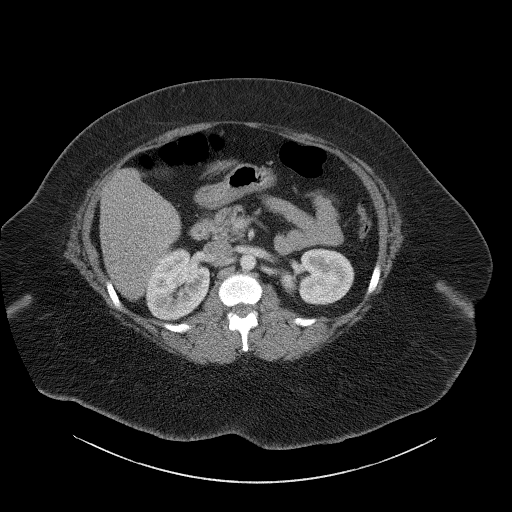
[im 75/100  soft-tissue]
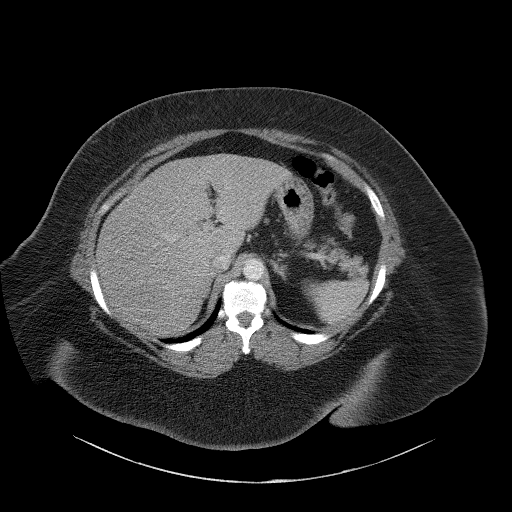
[im 80/100  soft-tissue]
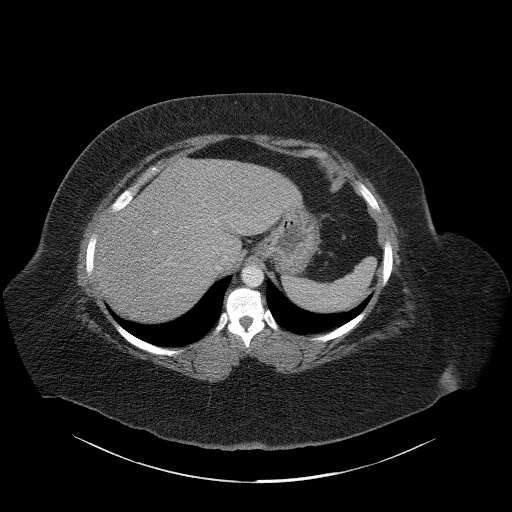
[im 85/100  soft-tissue]
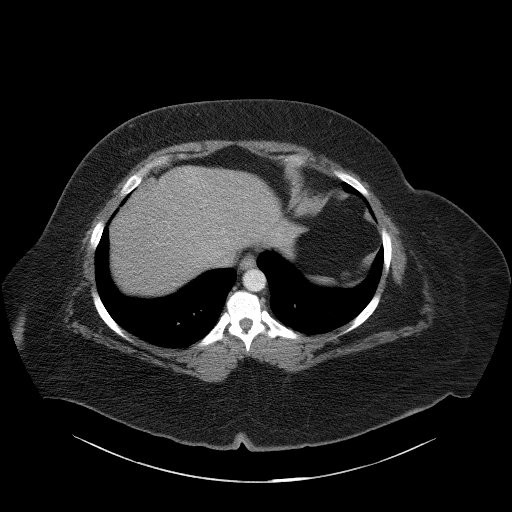
[im 95/100  soft-tissue]
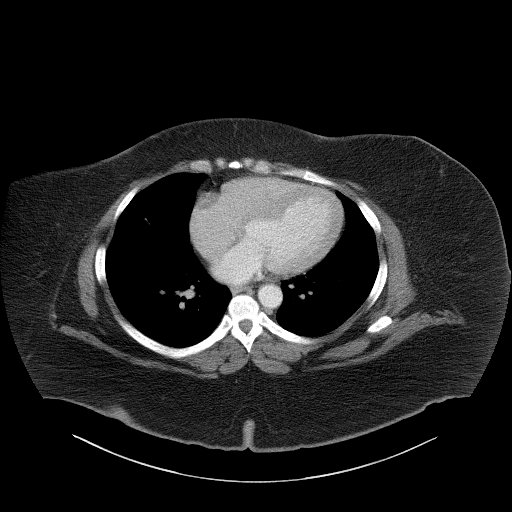

[Series 5: abd/pelvis 3.0 coronal · coronal · 1.09mm/px · 3 of 114 slices shown]
[im 38/114  soft-tissue]
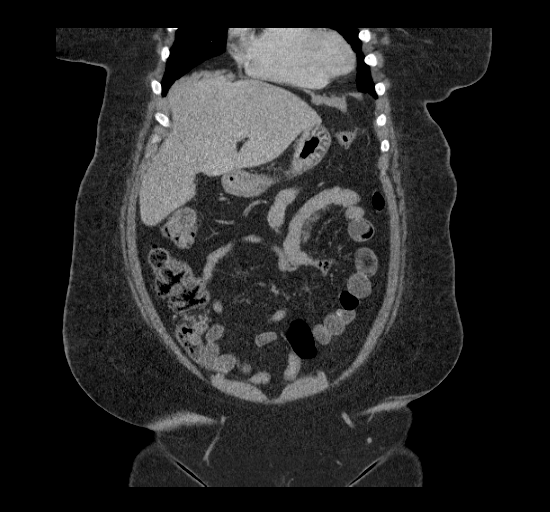
[im 51/114  soft-tissue]
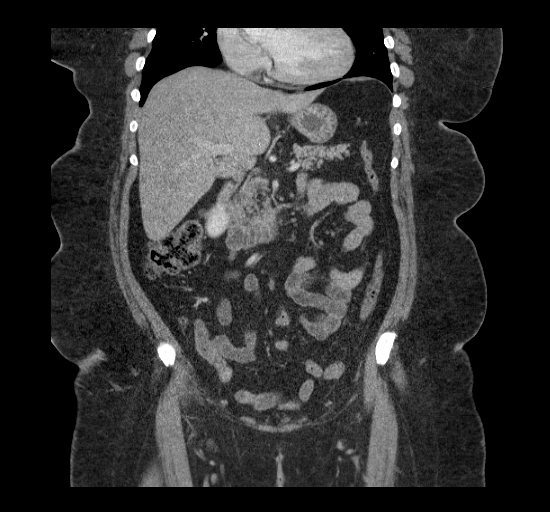
[im 63/114  soft-tissue]
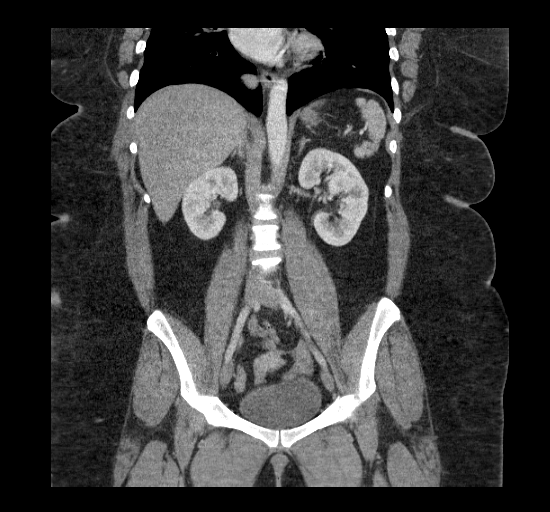

[17 of 46 positions shown; findings below may reference images not displayed]

FINDINGS: The visualized lung bases are clear.

The liver and spleen are unremarkable in appearance. The patient is
status post cholecystectomy, with clips noted at the gallbladder
fossa. The pancreas and adrenal glands are unremarkable.

The kidneys are unremarkable in appearance. There is no evidence of
hydronephrosis. No renal or ureteral stones are seen. No perinephric
stranding is appreciated.

No free fluid is identified. The small bowel is unremarkable in
appearance. The stomach is within normal limits. No acute vascular
abnormalities are seen.

The appendix is normal in caliber, without evidence of appendicitis.
The colon is unremarkable in appearance.

The bladder is mildly distended and grossly unremarkable. The uterus
is unremarkable in appearance. The ovaries are relatively symmetric,
with a likely left-sided follicle. No suspicious adnexal masses are
seen. No inguinal lymphadenopathy is seen.

No acute osseous abnormalities are identified.
IMPRESSION: Unremarkable contrast-enhanced CT of the abdomen and pelvis.

## 2017-05-20 ENCOUNTER — Emergency Department (HOSPITAL_BASED_OUTPATIENT_CLINIC_OR_DEPARTMENT_OTHER)
Admission: EM | Admit: 2017-05-20 | Discharge: 2017-05-20 | Disposition: A | Discharge status: Home / Self Care | Payer: Self-pay | Attending: Emergency Medicine | Admitting: Emergency Medicine

## 2017-05-20 ENCOUNTER — Encounter (HOSPITAL_BASED_OUTPATIENT_CLINIC_OR_DEPARTMENT_OTHER): Payer: Self-pay | Admitting: Emergency Medicine

## 2017-05-20 ENCOUNTER — Emergency Department (HOSPITAL_BASED_OUTPATIENT_CLINIC_OR_DEPARTMENT_OTHER): Payer: Self-pay

## 2017-05-20 DIAGNOSIS — S93492A Sprain of other ligament of left ankle, initial encounter: Secondary | ICD-10-CM | POA: Insufficient documentation

## 2017-05-20 DIAGNOSIS — F17210 Nicotine dependence, cigarettes, uncomplicated: Secondary | ICD-10-CM | POA: Insufficient documentation

## 2017-05-20 DIAGNOSIS — Z79899 Other long term (current) drug therapy: Secondary | ICD-10-CM | POA: Insufficient documentation

## 2017-05-20 DIAGNOSIS — W0110XA Fall on same level from slipping, tripping and stumbling with subsequent striking against unspecified object, initial encounter: Secondary | ICD-10-CM | POA: Insufficient documentation

## 2017-05-20 DIAGNOSIS — Y929 Unspecified place or not applicable: Secondary | ICD-10-CM | POA: Insufficient documentation

## 2017-05-20 DIAGNOSIS — I10 Essential (primary) hypertension: Secondary | ICD-10-CM | POA: Insufficient documentation

## 2017-05-20 DIAGNOSIS — Y9301 Activity, walking, marching and hiking: Secondary | ICD-10-CM | POA: Insufficient documentation

## 2017-05-20 DIAGNOSIS — Y999 Unspecified external cause status: Secondary | ICD-10-CM | POA: Insufficient documentation

## 2017-05-20 MED ORDER — OXYCODONE HCL 5 MG PO TABS
5.0000 mg | ORAL_TABLET | Freq: Once | ORAL | Status: AC
  Administered 2017-05-20: 5 mg via ORAL
  Filled 2017-05-20: qty 1

## 2017-05-20 MED ORDER — ACETAMINOPHEN 500 MG PO TABS
1000.0000 mg | ORAL_TABLET | Freq: Once | ORAL | Status: AC
  Administered 2017-05-20: 1000 mg via ORAL
  Filled 2017-05-20: qty 2

## 2017-05-20 MED ORDER — IBUPROFEN 800 MG PO TABS
800.0000 mg | ORAL_TABLET | Freq: Once | ORAL | Status: AC
  Administered 2017-05-20: 800 mg via ORAL
  Filled 2017-05-20: qty 1

## 2017-05-20 NOTE — ED Triage Notes (Signed)
Pt c/o LT ankle pain after slipping down a hill this a.m. Swelling noted.

## 2017-05-20 NOTE — Discharge Instructions (Signed)
Take 4 over the counter ibuprofen tablets 3 times a day or 2 over-the-counter naproxen tablets twice a day for pain. Also take tylenol 1000mg(2 extra strength) four times a day.    

## 2017-05-20 NOTE — ED Provider Notes (Signed)
[facility]   CSN: [id number] Arrival date & time: 05/20/17  0931     History   Chief Complaint Chief Complaint  Patient presents with  . Ankle Injury    HPI Anita Montoya is a 36 y.o. female.  36 yo F with a cc of left ankle pain. The patient slipped while walking down a hill. Thinks she inverted her ankle. Pain and swelling to the area. Pain mostly along the medial aspect. Had a prior injury to that same ankle about 3 years ago. Denies knee pain or foot pain.   The history is provided by the patient.  Ankle Injury  This is a new problem. The current episode started 3 to 5 hours ago. The problem occurs constantly. The problem has not changed since onset.Pertinent negatives include no chest pain, no headaches and no shortness of breath. Nothing aggravates the symptoms. Nothing relieves the symptoms. She has tried nothing for the symptoms. The treatment provided no relief.    Past Medical History:  Diagnosis Date  . Duplicated left renal collecting system 2009   noted on 2009 CT abdomen and pelvis   . HLD (hyperlipidemia) 08/2013  . Hypertension 1995  . Menorrhagia 1989    Patient Active Problem List   Diagnosis Date Noted  . PCOS (polycystic ovarian syndrome) 03/19/2015  . Low back pain 03/19/2015  . RLQ abdominal pain 03/10/2015  . Left ankle injury 06/12/2014  . Essential hypertension, benign 12/15/2013  . Menorrhagia with irregular cycle 12/15/2013  . Morbid obesity (HCC) 12/15/2013  . Hirsutism 12/15/2013    Past Surgical History:  Procedure Laterality Date  . CHOLECYSTECTOMY    . gallbadder removal    . uterine poly removed   2009   heavy menstrual bleeding     OB History    Gravida Para Term Preterm AB Living   0 0           SAB TAB Ectopic Multiple Live Births                   Home Medications    Prior to Admission medications   Medication Sig Start Date End Date Taking? Authorizing Provider  atorvastatin (LIPITOR)  10 MG tablet Take 10 mg by mouth daily.    [provider]  chlorthalidone (HYGROTON) 25 MG tablet Take 1 tablet (25 mg total) by mouth daily. 03/19/15   Funches, Gerilyn Nestle, MD  lisinopril (PRINIVIL,ZESTRIL) 40 MG tablet Take 1 tablet (40 mg total) by mouth daily. 03/19/15   Funches, Gerilyn Nestle, MD  medroxyPROGESTERone (PROVERA) 10 MG tablet Take 1 tablet (10 mg total) by mouth daily. One tab daily for five days starting on the first day of the month. Repeat monthly to regulate cycle. Patient not taking: Reported on 03/19/2015 12/15/13   Dessa Phi, MD  meloxicam (MOBIC) 7.5 MG tablet Take 1 tablet (7.5 mg total) by mouth daily. Patient not taking: Reported on 03/19/2015 03/10/15   Cy Blamer, MD    Family History Family History  Problem Relation Age of Onset  . Diabetes Father   . Heart disease Brother     Social History Social History  Substance Use Topics  . Smoking status: Current Every Day Smoker    Packs/day: 0.00    Types: Cigarettes  . Smokeless tobacco: Never Used  . Alcohol use Yes     Comment: occasional     Allergies   Patient has no known allergies.   Review of Systems Review of  Systems  Constitutional: Negative for chills and fever.  HENT: Negative for congestion and rhinorrhea.   Eyes: Negative for redness and visual disturbance.  Respiratory: Negative for shortness of breath and wheezing.   Cardiovascular: Negative for chest pain and palpitations.  Gastrointestinal: Negative for nausea and vomiting.  Genitourinary: Negative for dysuria and urgency.  Musculoskeletal: Positive for arthralgias and myalgias.  Skin: Negative for pallor and wound.  Neurological: Negative for dizziness and headaches.     Physical Exam Updated Vital Signs BP (!) 139/122   Pulse 89   Resp 20   LMP 05/18/2017 (Exact Date)   SpO2 100%   Physical Exam  Constitutional: She is oriented to person, place, and time. She appears well-developed and well-nourished. No distress.   HENT:  Head: Normocephalic and atraumatic.  Eyes: EOM are normal. Pupils are equal, round, and reactive to light.  Neck: Normal range of motion. Neck supple.  Cardiovascular: Normal rate and regular rhythm.  Exam reveals no gallop and no friction rub.   No murmur heard. Pulmonary/Chest: Effort normal. She has no wheezes. She has no rales.  Abdominal: Soft. She exhibits no distension. There is no tenderness.  Musculoskeletal: She exhibits edema and tenderness.  Significant edema to the left ankle. Mostly in the lateral aspect. Her pain is localized to the medial aspect along the attachments of the deltoid ligament to the medial malleolus. No pain along the cuboid or the base of the fifth metatarsal. No fibular head tenderness.  Neurological: She is alert and oriented to person, place, and time.  Skin: Skin is warm and dry. She is not diaphoretic.  Psychiatric: She has a normal mood and affect. Her behavior is normal.  Nursing note and vitals reviewed.    ED Treatments / Results  Labs (all labs ordered are listed, but only abnormal results are displayed) Labs Reviewed - No data to display  EKG  EKG Interpretation None       Radiology Dg Ankle Complete Left  Result Date: 05/20/2017 CLINICAL DATA:  Lateral ankle pain after injury today. EXAM: LEFT ANKLE COMPLETE - 3+ VIEW COMPARISON:  None. FINDINGS: There is no evidence of fracture, dislocation, or joint effusion. There is no evidence of arthropathy or other focal bone abnormality. There is soft tissue swelling lateral ankle. IMPRESSION: No bony abnormality.  Soft tissue swelling lateral ankle. Electronically Signed   By: Sherian ReinWei-Chen  Lin M.D.   On: 05/20/2017 10:05    Procedures Procedures (including critical care time)  Medications Ordered in ED Medications  acetaminophen (TYLENOL) tablet 1,000 mg (1,000 mg Oral Given 05/20/17 1002)  ibuprofen (ADVIL,MOTRIN) tablet 800 mg (800 mg Oral Given 05/20/17 1002)  oxyCODONE (Oxy  IR/ROXICODONE) immediate release tablet 5 mg (5 mg Oral Given 05/20/17 1002)     Initial Impression / Assessment and Plan / ED Course  I have reviewed the triage vital signs and the nursing notes.  Pertinent labs & imaging results that were available during my care of the patient were reviewed by me and considered in my medical decision making (see chart for details).     36 yo F With a chief complaint of left ankle pain. X-rays negative. Given a splint and crutches. Sports medicine follow-up.  10:16 AM:  I have discussed the diagnosis/risks/treatment options with the patient and family and believe the pt to be eligible for discharge home to follow-up with Sports med. We also discussed returning to the ED immediately if new or worsening sx occur. We discussed the sx which  are most concerning (e.g., sudden worsening pain, fever, inability to tolerate by mouth) that necessitate immediate return. Medications administered to the patient during their visit and any new prescriptions provided to the patient are listed below.  Medications given during this visit Medications  acetaminophen (TYLENOL) tablet 1,000 mg (1,000 mg Oral Given 05/20/17 1002)  ibuprofen (ADVIL,MOTRIN) tablet 800 mg (800 mg Oral Given 05/20/17 1002)  oxyCODONE (Oxy IR/ROXICODONE) immediate release tablet 5 mg (5 mg Oral Given 05/20/17 1002)     The patient appears reasonably screen and/or stabilized for discharge and I doubt any other medical condition or other [facility] requiring further screening, evaluation, or treatment in the ED at this time prior to discharge.    Final Clinical Impressions(s) / ED Diagnoses   Final diagnoses:  Sprain of anterior talofibular ligament of left ankle, initial encounter    New Prescriptions New Prescriptions   No medications on file     Melene Plan, DO 05/20/17 1016

## 2019-01-15 DIAGNOSIS — I10 Essential (primary) hypertension: Secondary | ICD-10-CM | POA: Insufficient documentation

## 2019-02-01 IMAGING — DX DG ANKLE COMPLETE 3+V*L*
3 series · 3 of 3 positions shown · non-contrast
Comparison: None.

CLINICAL DATA: Lateral ankle pain after injury today.

EXAM:
LEFT ANKLE COMPLETE - 3+ VIEW

[ankle ap]
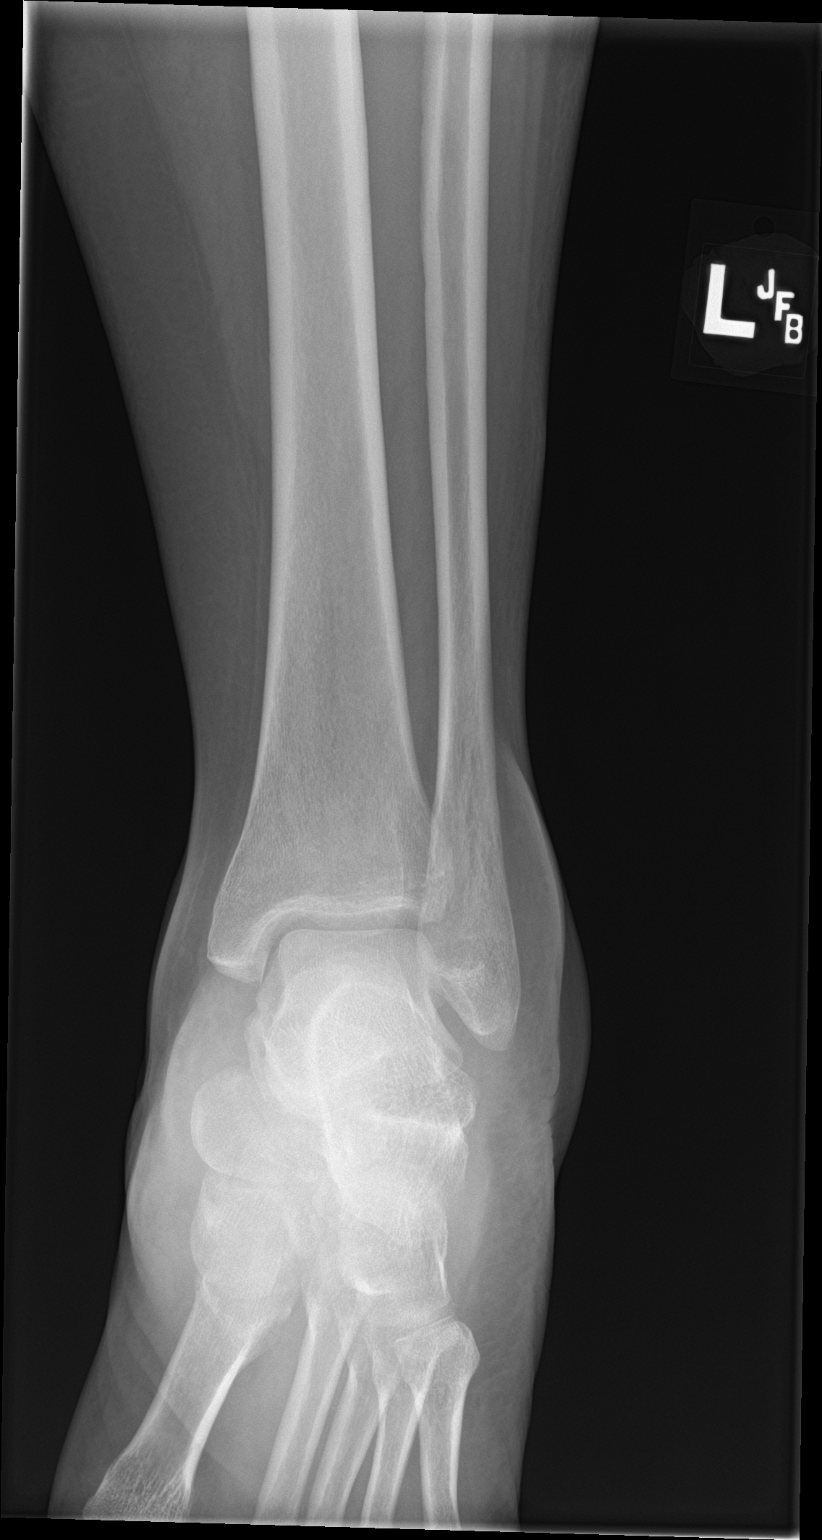

[ankle obl]
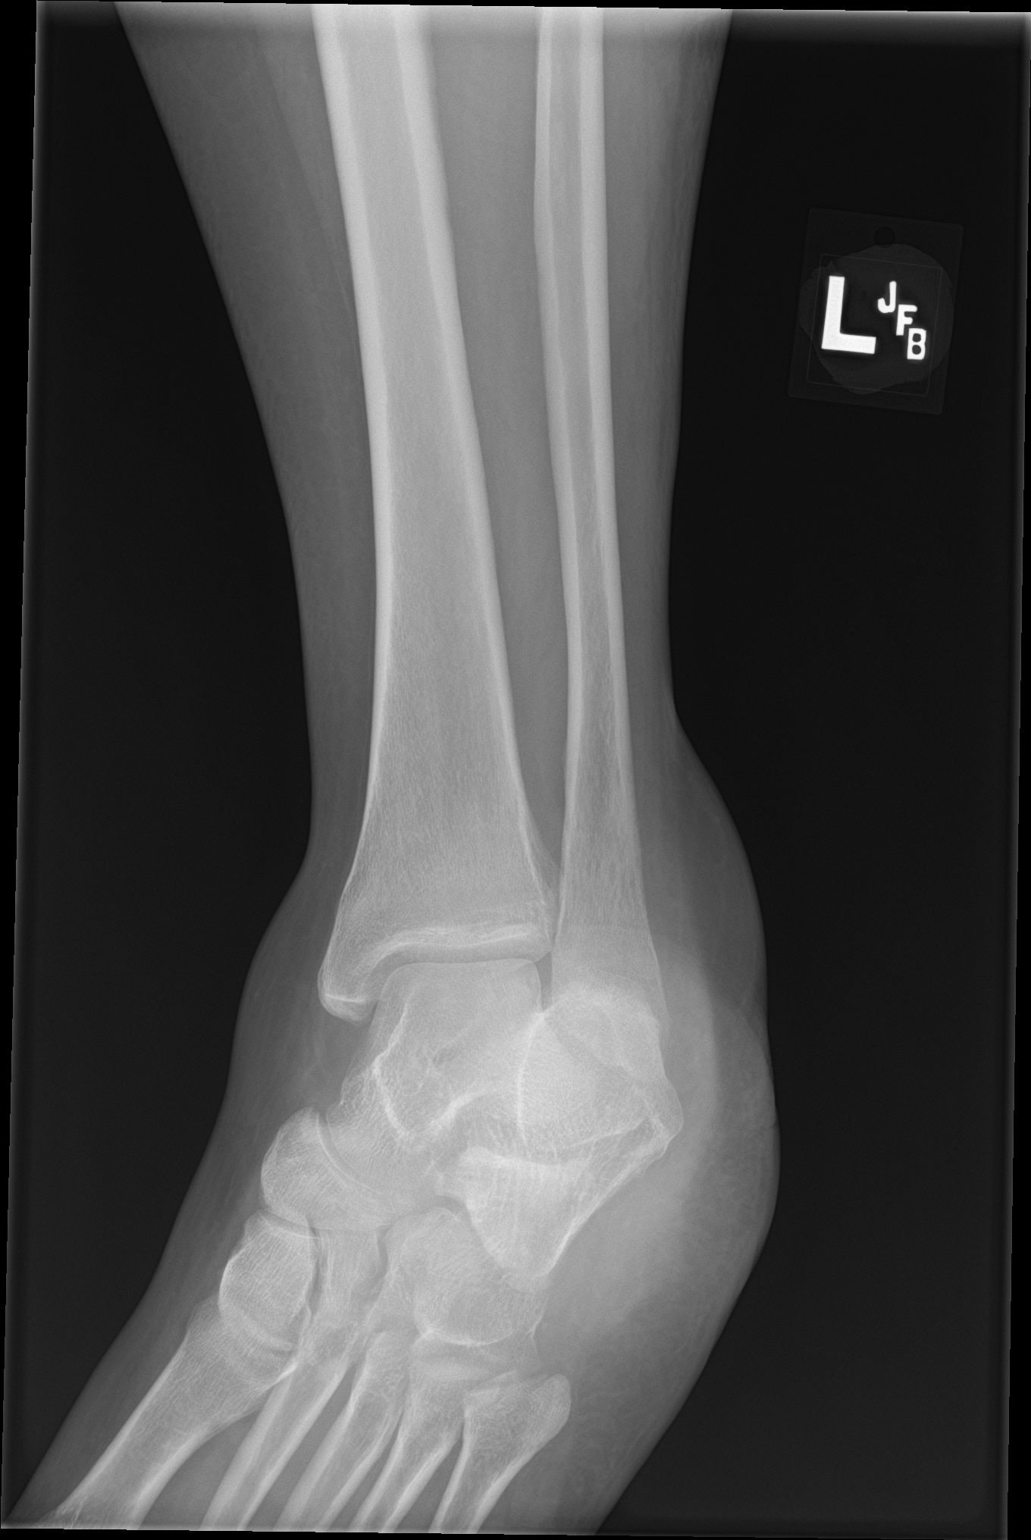

[ankle lat]
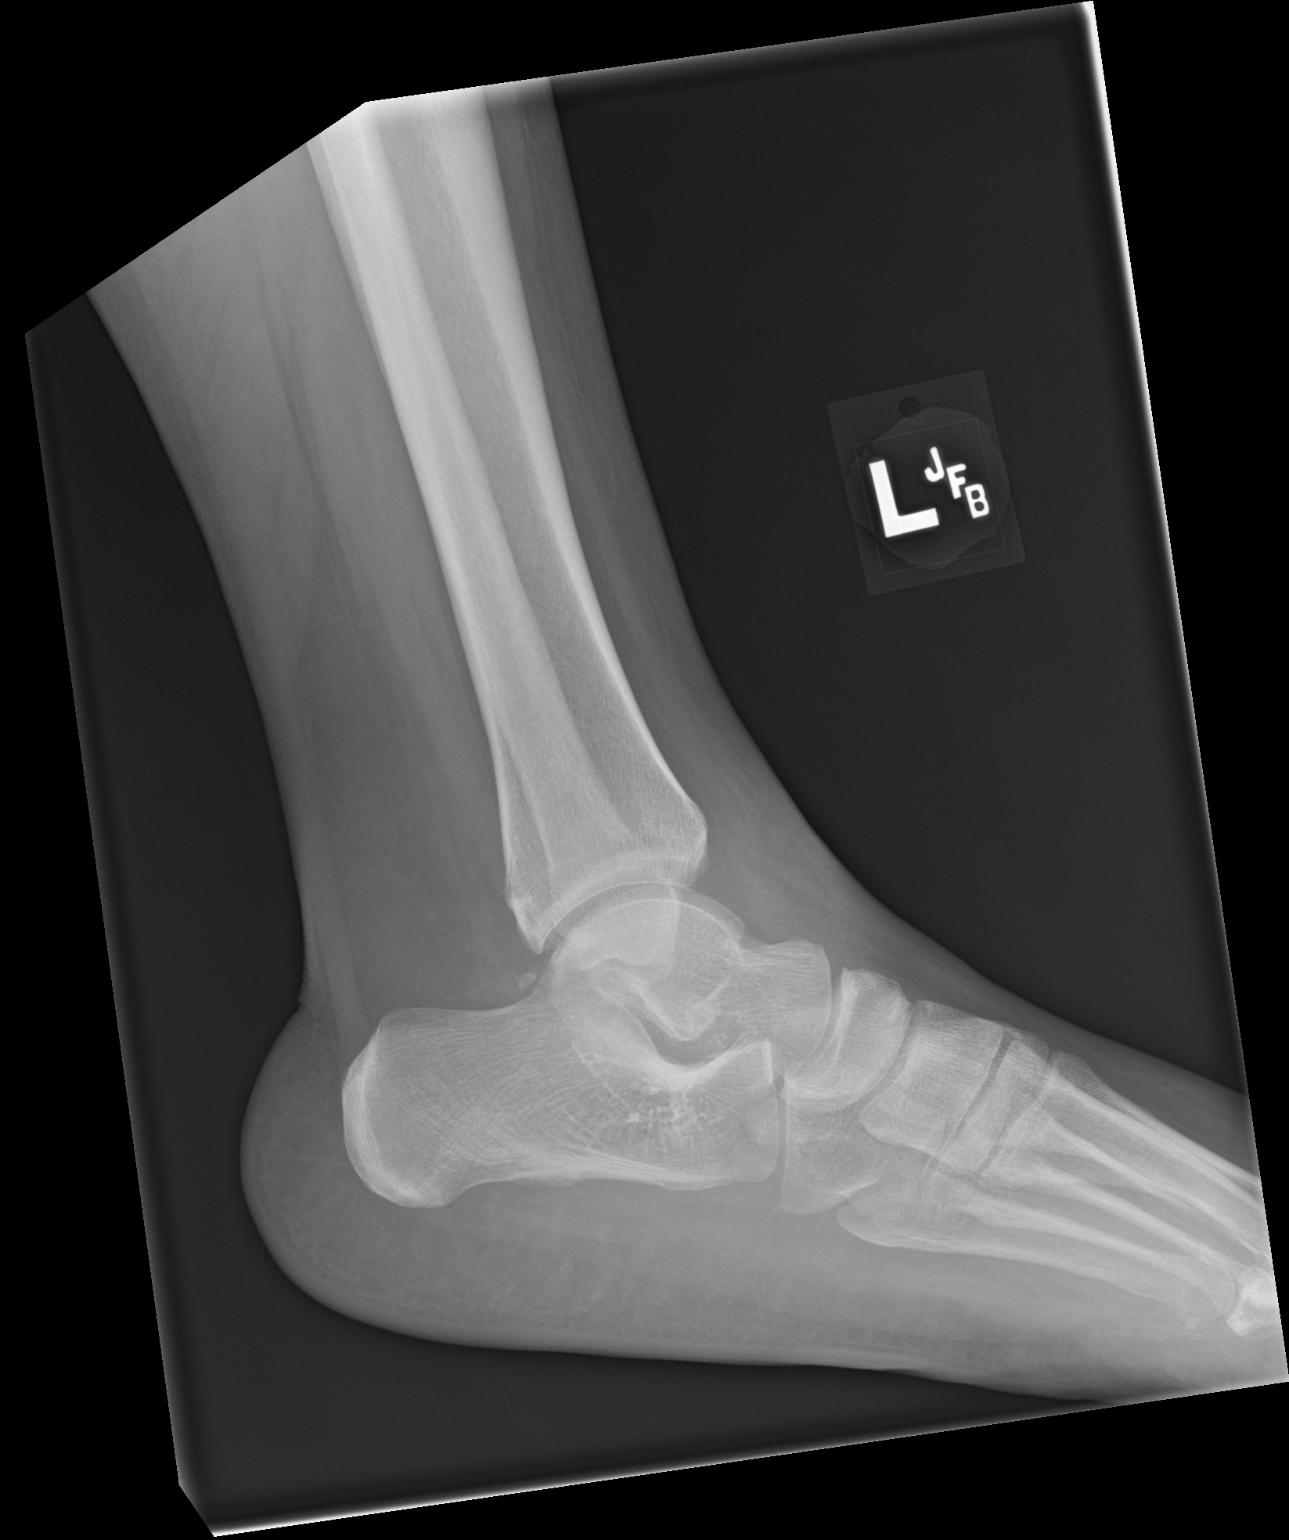

[3 of 3 positions shown; findings below may reference images not displayed]

FINDINGS: There is no evidence of fracture, dislocation, or joint effusion.
There is no evidence of arthropathy or other focal bone abnormality.
There is soft tissue swelling lateral ankle.
IMPRESSION: No bony abnormality.  Soft tissue swelling lateral ankle.

## 2019-08-22 ENCOUNTER — Other Ambulatory Visit: Payer: Self-pay

## 2019-08-22 DIAGNOSIS — Z20822 Contact with and (suspected) exposure to covid-19: Secondary | ICD-10-CM

## 2019-08-23 LAB — NOVEL CORONAVIRUS, NAA: SARS-CoV-2, NAA: NOT DETECTED

## 2019-10-21 DIAGNOSIS — K859 Acute pancreatitis without necrosis or infection, unspecified: Secondary | ICD-10-CM | POA: Insufficient documentation

## 2019-10-24 ENCOUNTER — Other Ambulatory Visit: Payer: Self-pay

## 2019-10-24 ENCOUNTER — Encounter (HOSPITAL_BASED_OUTPATIENT_CLINIC_OR_DEPARTMENT_OTHER): Payer: Self-pay

## 2019-10-24 ENCOUNTER — Emergency Department (HOSPITAL_BASED_OUTPATIENT_CLINIC_OR_DEPARTMENT_OTHER)
Admission: EM | Admit: 2019-10-24 | Discharge: 2019-10-24 | Disposition: A | Discharge status: Home / Self Care | Payer: BLUE CROSS/BLUE SHIELD | Attending: Emergency Medicine | Admitting: Emergency Medicine

## 2019-10-24 DIAGNOSIS — R1010 Upper abdominal pain, unspecified: Secondary | ICD-10-CM | POA: Insufficient documentation

## 2019-10-24 DIAGNOSIS — I10 Essential (primary) hypertension: Secondary | ICD-10-CM | POA: Diagnosis not present

## 2019-10-24 DIAGNOSIS — Z8719 Personal history of other diseases of the digestive system: Secondary | ICD-10-CM | POA: Insufficient documentation

## 2019-10-24 DIAGNOSIS — Z79899 Other long term (current) drug therapy: Secondary | ICD-10-CM | POA: Insufficient documentation

## 2019-10-24 DIAGNOSIS — F17210 Nicotine dependence, cigarettes, uncomplicated: Secondary | ICD-10-CM | POA: Insufficient documentation

## 2019-10-24 HISTORY — DX: Acute pancreatitis without necrosis or infection, unspecified: K85.90

## 2019-10-24 HISTORY — DX: Herpesviral infection, unspecified: B00.9

## 2019-10-24 MED ORDER — TRAMADOL HCL 50 MG PO TABS: 50.0000 mg | ORAL_TABLET | Freq: Four times a day (QID) | ORAL | 0 refills | Status: DC | PRN

## 2019-10-24 MED ORDER — HYDROMORPHONE HCL 1 MG/ML IJ SOLN
1.0000 mg | Freq: Once | INTRAMUSCULAR | Status: AC
  Administered 2019-10-24: 1 mg via INTRAMUSCULAR
  Filled 2019-10-24: qty 1

## 2019-10-24 MED ORDER — ALUM & MAG HYDROXIDE-SIMETH 200-200-20 MG/5ML PO SUSP
30.0000 mL | Freq: Once | ORAL | Status: AC
  Administered 2019-10-24: 30 mL via ORAL
  Filled 2019-10-24: qty 30

## 2019-10-24 MED ORDER — FAMOTIDINE 20 MG PO TABS
20.0000 mg | ORAL_TABLET | Freq: Once | ORAL | Status: AC
  Administered 2019-10-24: 20 mg via ORAL
  Filled 2019-10-24: qty 1

## 2019-10-24 MED ORDER — PANTOPRAZOLE SODIUM 40 MG PO TBEC: 40.0000 mg | DELAYED_RELEASE_TABLET | Freq: Every day | ORAL | 0 refills | Status: DC

## 2019-10-24 NOTE — ED Provider Notes (Signed)
[facility] EMERGENCY DEPARTMENT Provider Note   CSN: [id number] Arrival date & time: 10/24/19  1816     History   Chief Complaint Chief Complaint  Patient presents with  . Abdominal Pain    HPI Anita Montoya is a 38 y.o. female.     Patient c/o upper abd pain in past week, stating was hospitalized at [facility] for same. States no definite cause found then. No hx etoh abuse, remote hx cholecystectomy. Patient indicates while pain improved during hospital stay it never went away completely and now that pain persists. Pain is dull, moderate, epigastric and upper abd, non radiating. Denies vomiting or diarrhea. No abd distension. No chest pain or discomfort. No cough or uri symptoms. No dysuria or gu c/o. No hx pud.   The history is provided by the patient.  Abdominal Pain Associated symptoms: no chest pain, no chills, no cough, no diarrhea, no dysuria, no fever, no shortness of breath, no sore throat and no vomiting     Past Medical History:  Diagnosis Date  . Duplicated left renal collecting system 2009   noted on 2009 CT abdomen and pelvis   . Herpes simplex   . HLD (hyperlipidemia) 08/2013  . Hypertension 1995  . Menorrhagia 1989  . Pancreatitis     Patient Active Problem List   Diagnosis Date Noted  . PCOS (polycystic ovarian syndrome) 03/19/2015  . Low back pain 03/19/2015  . RLQ abdominal pain 03/10/2015  . Left ankle injury 06/12/2014  . Essential hypertension, benign 12/15/2013  . Menorrhagia with irregular cycle 12/15/2013  . Morbid obesity (HCC) 12/15/2013  . Hirsutism 12/15/2013    Past Surgical History:  Procedure Laterality Date  . CHOLECYSTECTOMY    . gallbadder removal    . uterine poly removed   2009   heavy menstrual bleeding      OB History    Gravida  0   Para  0   Term      Preterm      AB      Living        SAB      TAB      Ectopic      Multiple      Live Births               Home Medications    Prior to  Admission medications   Medication Sig Start Date End Date Taking? Authorizing Provider  atorvastatin (LIPITOR) 10 MG tablet Take 10 mg by mouth daily.    [provider]  chlorthalidone (HYGROTON) 25 MG tablet Take 1 tablet (25 mg total) by mouth daily. 03/19/15   Funches, Gerilyn Nestle, MD  lisinopril (PRINIVIL,ZESTRIL) 40 MG tablet Take 1 tablet (40 mg total) by mouth daily. 03/19/15   Funches, Gerilyn Nestle, MD  medroxyPROGESTERone (PROVERA) 10 MG tablet Take 1 tablet (10 mg total) by mouth daily. One tab daily for five days starting on the first day of the month. Repeat monthly to regulate cycle. Patient not taking: Reported on 03/19/2015 12/15/13   Dessa Phi, MD  meloxicam (MOBIC) 7.5 MG tablet Take 1 tablet (7.5 mg total) by mouth daily. Patient not taking: Reported on 03/19/2015 03/10/15   Cy Blamer, MD    Family History Family History  Problem Relation Age of Onset  . Diabetes Father   . Heart disease Brother     Social History Social History   Tobacco Use  . Smoking status: Current Every Day Smoker  Packs/day: 0.00    Types: Cigarettes  . Smokeless tobacco: Never Used  Substance Use Topics  . Alcohol use: Yes    Comment: occasional  . Drug use: No     Allergies   Patient has no known allergies.   Review of Systems Review of Systems  Constitutional: Negative for chills and fever.  HENT: Negative for sore throat.   Eyes: Negative for redness.  Respiratory: Negative for cough and shortness of breath.   Cardiovascular: Negative for chest pain.  Gastrointestinal: Positive for abdominal pain. Negative for diarrhea and vomiting.  Genitourinary: Negative for dysuria and flank pain.  Musculoskeletal: Negative for back pain and neck pain.  Skin: Negative for rash.  Neurological: Negative for headaches.  Hematological: Does not bruise/bleed easily.  Psychiatric/Behavioral: Negative for confusion.     Physical Exam Updated Vital Signs BP (!) 162/102 (BP  Location: Right Arm)   Pulse 95   Temp 98.6 F (37 C) (Oral)   Resp 20   Ht 1.626 m ( )   Wt 133.4 kg   SpO2 100%   BMI 50.46 kg/m   Physical Exam Vitals signs and nursing note reviewed.  Constitutional:      Appearance: Normal appearance. She is well-developed.  HENT:     Head: Atraumatic.     Nose: Nose normal.     Mouth/Throat:     Mouth: Mucous membranes are moist.  Eyes:     General: No scleral icterus.    Conjunctiva/sclera: Conjunctivae normal.  Neck:     Musculoskeletal: Normal range of motion and neck supple. No neck rigidity or muscular tenderness.     Trachea: No tracheal deviation.  Cardiovascular:     Rate and Rhythm: Normal rate and regular rhythm.     Pulses: Normal pulses.     Heart sounds: Normal heart sounds. No murmur. No friction rub. No gallop.   Pulmonary:     Effort: Pulmonary effort is normal. No respiratory distress.     Breath sounds: Normal breath sounds.  Abdominal:     General: Bowel sounds are normal. There is no distension.     Palpations: Abdomen is soft.     Tenderness: There is abdominal tenderness. There is no guarding.     Comments: Obese. Mild epigastric tenderness. No incarc hernia. No skin changes, lesions, or sts.   Genitourinary:    Comments: No cva tenderness.  Musculoskeletal:        General: No swelling or tenderness.  Skin:    General: Skin is warm and dry.     Findings: No rash.  Neurological:     Mental Status: She is alert.     Comments: Alert, speech normal.   Psychiatric:        Mood and Affect: Mood normal.      ED Treatments / Results  Labs (all labs ordered are listed, but only abnormal results are displayed) Labs in care everywhere from earlier today. lilpase normal. lfts normal.  EKG None  Radiology No results found.  Procedures Procedures (including critical care time)  Medications Ordered in ED Medications - No data to display   Initial Impression / Assessment and Plan / ED Course  I  have reviewed the triage vital signs and the nursing notes.  Pertinent labs & imaging results that were available during my care of the patient were reviewed by me and considered in my medical decision making (see chart for details).  Reviewed nursing notes and prior charts for additional history.  Patient was at [facility] earlier today with same, and left due to wait - labs there reviewed from today - lfts normal, hgb normal, lipase normal.  Recent ct scan there from 4 days also reviewed - neg for acute process.   Dilaudid 1 mg im. pepcid po, gi cocktail po.   Recheck pt, pain resolved. abd soft nt. No vomiting. Afebrile.   bp is high, hx same - pt notes bp med recently changed, now labetalol 200 mg bid, and has not yet taken the evening dose. Has with her, will take now.  No cp. No headache.  Family member asks about possible antacid med for home - will provide rx. Will also provide small quantity pain med rx.    Discussed close pcp/gi f/u.  Return precautions provided.  Pt currently appears stable for d/c.     Final Clinical Impressions(s) / ED Diagnoses   Final diagnoses:  None    ED Discharge Orders    None       Lajean Saver, MD 10/24/19 2014

## 2019-10-24 NOTE — ED Triage Notes (Addendum)
Pt c/o right side abd pain-d/c from [facility] for pancreatitis 12/7-NAD-steady gait-pt states she LWBS at [facility] ED due to wait

## 2019-10-24 NOTE — ED Notes (Signed)
  Patient given water and took home BP med.  Patient states her blood pressure is always high no matter what medication she takes.

## 2019-10-24 NOTE — Discharge Instructions (Addendum)
It was our pleasure to provide your ER care today - we hope that you feel better.  Take acid blocker medication as prescribed. You may also try maalox or mylanta for symptom relief.   You may take ultram as need for pain - no driving for the next 6 hours, or when taking ultram.  Drink plenty of fluids. Advance diet as tolerated.   Follow up with primary care doctor/gi doctor in the next 1-2 weeks. Also have your blood pressure rechecked then, as it is high today.   Return to ER right away if worse, new symptoms, fevers, worsening or severe pain, persistent vomiting, chest pain,  trouble breathing, or other concern.

## 2022-11-14 DIAGNOSIS — F4323 Adjustment disorder with mixed anxiety and depressed mood: Secondary | ICD-10-CM | POA: Insufficient documentation

## 2022-11-14 DIAGNOSIS — F32A Depression, unspecified: Secondary | ICD-10-CM | POA: Insufficient documentation

## 2023-02-10 ENCOUNTER — Emergency Department (HOSPITAL_BASED_OUTPATIENT_CLINIC_OR_DEPARTMENT_OTHER)
Admission: EM | Admit: 2023-02-10 | Discharge: 2023-02-10 | Disposition: A | Discharge status: Home / Self Care | Payer: BC Managed Care – PPO | Attending: Emergency Medicine | Admitting: Emergency Medicine

## 2023-02-10 ENCOUNTER — Emergency Department (HOSPITAL_BASED_OUTPATIENT_CLINIC_OR_DEPARTMENT_OTHER): Payer: BC Managed Care – PPO

## 2023-02-10 ENCOUNTER — Emergency Department (HOSPITAL_COMMUNITY): Payer: BC Managed Care – PPO

## 2023-02-10 ENCOUNTER — Other Ambulatory Visit: Payer: Self-pay

## 2023-02-10 DIAGNOSIS — I639 Cerebral infarction, unspecified: Secondary | ICD-10-CM | POA: Diagnosis not present

## 2023-02-10 DIAGNOSIS — R519 Headache, unspecified: Secondary | ICD-10-CM

## 2023-02-10 DIAGNOSIS — Z79899 Other long term (current) drug therapy: Secondary | ICD-10-CM | POA: Diagnosis not present

## 2023-02-10 DIAGNOSIS — I10 Essential (primary) hypertension: Secondary | ICD-10-CM | POA: Insufficient documentation

## 2023-02-10 LAB — CBC WITH DIFFERENTIAL/PLATELET
Abs Immature Granulocytes: 0.03 10*3/uL (ref 0.00–0.07)
Basophils Absolute: 0 10*3/uL (ref 0.0–0.1)
Basophils Relative: 0 %
Eosinophils Absolute: 0 10*3/uL (ref 0.0–0.5)
Eosinophils Relative: 0 %
HCT: 47.4 % — ABNORMAL HIGH (ref 36.0–46.0)
Hemoglobin: 15.2 g/dL — ABNORMAL HIGH (ref 12.0–15.0)
Immature Granulocytes: 0 %
Lymphocytes Relative: 12 %
Lymphs Abs: 1.1 10*3/uL (ref 0.7–4.0)
MCH: 25.6 pg — ABNORMAL LOW (ref 26.0–34.0)
MCHC: 32.1 g/dL (ref 30.0–36.0)
MCV: 79.9 fL — ABNORMAL LOW (ref 80.0–100.0)
Monocytes Absolute: 0.2 10*3/uL (ref 0.1–1.0)
Monocytes Relative: 2 %
Neutro Abs: 8.4 10*3/uL — ABNORMAL HIGH (ref 1.7–7.7)
Neutrophils Relative %: 86 %
Platelets: 455 10*3/uL — ABNORMAL HIGH (ref 150–400)
RBC: 5.93 MIL/uL — ABNORMAL HIGH (ref 3.87–5.11)
RDW: 14.6 % (ref 11.5–15.5)
WBC: 9.8 10*3/uL (ref 4.0–10.5)
nRBC: 0 % (ref 0.0–0.2)

## 2023-02-10 LAB — BASIC METABOLIC PANEL
Anion gap: 10 (ref 5–15)
BUN: 10 mg/dL (ref 6–20)
CO2: 24 mmol/L (ref 22–32)
Calcium: 8.9 mg/dL (ref 8.9–10.3)
Chloride: 104 mmol/L (ref 98–111)
Creatinine, Ser: 0.83 mg/dL (ref 0.44–1.00)
GFR, Estimated: >60 mL/min (ref >60)
Glucose, Bld: 116 mg/dL — ABNORMAL HIGH (ref 70–99)
Potassium: 4 mmol/L (ref 3.5–5.1)
Sodium: 138 mmol/L (ref 135–145)

## 2023-02-10 MED ORDER — LOSARTAN POTASSIUM 25 MG PO TABS: 25.0000 mg | ORAL_TABLET | Freq: Every day | ORAL | 0 refills | Status: DC

## 2023-02-10 MED ORDER — METOCLOPRAMIDE HCL 10 MG PO TABS: 10.0000 mg | ORAL_TABLET | Freq: Four times a day (QID) | ORAL | 0 refills | Status: DC | PRN

## 2023-02-10 MED ORDER — DEXAMETHASONE SODIUM PHOSPHATE 10 MG/ML IJ SOLN
10.0000 mg | Freq: Once | INTRAMUSCULAR | Status: AC
  Administered 2023-02-10: 10 mg via INTRAVENOUS
  Filled 2023-02-10: qty 1

## 2023-02-10 MED ORDER — SODIUM CHLORIDE 0.9 % IV SOLN: INTRAVENOUS | Status: DC

## 2023-02-10 MED ORDER — METOCLOPRAMIDE HCL 5 MG/ML IJ SOLN
10.0000 mg | Freq: Once | INTRAMUSCULAR | Status: AC
  Administered 2023-02-10: 10 mg via INTRAVENOUS
  Filled 2023-02-10: qty 2

## 2023-02-10 MED ORDER — SODIUM CHLORIDE 0.9 % IV BOLUS
500.0000 mL | Freq: Once | INTRAVENOUS | Status: AC
  Administered 2023-02-10: 500 mL via INTRAVENOUS

## 2023-02-10 MED ORDER — DIPHENHYDRAMINE HCL 50 MG/ML IJ SOLN
25.0000 mg | Freq: Once | INTRAMUSCULAR | Status: AC
  Administered 2023-02-10: 25 mg via INTRAVENOUS
  Filled 2023-02-10: qty 1

## 2023-02-10 MED ORDER — HYDROMORPHONE HCL 1 MG/ML IJ SOLN
1.0000 mg | Freq: Once | INTRAMUSCULAR | Status: AC
  Administered 2023-02-10: 1 mg via INTRAVENOUS
  Filled 2023-02-10: qty 1

## 2023-02-10 NOTE — Discharge Instructions (Addendum)
Please continue taking your blood pressure medicine.  I have added losartan 25 mg daily to help you with your blood pressure  Take Tylenol or Motrin for headache and Reglan for severe headaches  See your doctor for follow-up.  I recommend that you call neurology for follow-up  Return to ER if you have worse headache, blood pressure over 200, vomiting, weakness or numbness

## 2023-02-10 NOTE — ED Notes (Signed)
Pulled Lav, Green, and Gold top for lab hold.

## 2023-02-10 NOTE — ED Provider Notes (Signed)
  Physical Exam  BP (!) 146/107   Pulse 83   Temp 98.4 F (36.9 C) (Oral)   Resp 17   Ht 5\' 3"  (1.6 m)   Wt 136.1 kg   LMP 11/15/2022   SpO2 97%   BMI 53.14 kg/m   Physical Exam  Procedures  Procedures  ED Course / MDM    Medical Decision Making Patient was transferred here from medicine [place] patient was in the ED and [place] several days ago and had a CT head that was unremarkable.  She then had worsening blurry vision and headache.  She went to medicine at [facility] had a CT that showed hypodensity in the left pons.  Neurology recommended MRI brain  7:55 PM MRI showed no acute intracranial process.  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  Patient is already on Norvasc and metoprolol.  Will add losartan.  Will have her follow-up with PCP in a week.  Amount and/or Complexity of Data Reviewed Labs: ordered. Decision-making details documented in ED Course. Radiology: ordered and independent interpretation performed. Decision-making details documented in ED Course.  Risk Prescription drug management.          Drenda Freeze, MD 02/10/23 [phone]

## 2023-02-10 NOTE — ED Triage Notes (Signed)
Pt arrived POV. Presents with report HA starting yesterday possible due to high blood pressure Pt reports seen in ED on Monday  and BP [id number] systolic. Took BP med last night and this am.

## 2023-02-10 NOTE — ED Triage Notes (Signed)
Pt BIB [facility] from [place] for MRI.  Pt states that Monday her vision "went out and could not see anything".  She states she was in the hospital all day that day.  She reports she also got a headache yesterday at home and today was unbearable and mother drove her to [place].

## 2023-02-10 NOTE — ED Provider Notes (Addendum)
[facility] Provider Note   CSN: [id number] Arrival date & time: 02/10/23  1117     History  Chief Complaint  Patient presents with   Hypertension   Headache         Anita Montoya is a 42 y.o. female.  Patient seen at [facility] emergency department on March 26.  At that time patient had onset of frontal headache had some visual changes.  Had workup there to include lab work and head CT.  Blood pressure was noted to be elevated.  Patient is taking her blood pressure medicines.  She is on Norvasc Toprol XL and Hygroton.  Patient states her blood pressures normally run high but they been running higher since this headache started.  Frontal headache persist.  No nausea no vomiting no further visual changes.  No numbness or weakness in the legs.  There is been some numbness associated with foot pain on the right side.  But that was pre-existing to these current symptoms.  Blood pressure here 192/117.  Repeat in the room it was 179/124 so markedly elevated.  Patient does have a primary care doctor.       Home Medications Prior to Admission medications   Medication Sig Start Date End Date Taking? Authorizing Provider  atorvastatin (LIPITOR) 10 MG tablet Take 10 mg by mouth daily.    [provider]  chlorthalidone (HYGROTON) 25 MG tablet Take 1 tablet (25 mg total) by mouth daily. 03/19/15   Funches, Adriana Mccallum, MD  lisinopril (PRINIVIL,ZESTRIL) 40 MG tablet Take 1 tablet (40 mg total) by mouth daily. 03/19/15   Funches, Adriana Mccallum, MD  medroxyPROGESTERone (PROVERA) 10 MG tablet Take 1 tablet (10 mg total) by mouth daily. One tab daily for five days starting on the first day of the month. Repeat monthly to regulate cycle. Patient not taking: Reported on 03/19/2015 12/15/13   Boykin Nearing, MD  meloxicam (MOBIC) 7.5 MG tablet Take 1 tablet (7.5 mg total) by mouth daily. Patient not taking: Reported on 03/19/2015 03/10/15   Palumbo, April,  MD  pantoprazole (PROTONIX) 40 MG tablet Take 1 tablet (40 mg total) by mouth daily. 10/24/19   Lajean Saver, MD  pantoprazole (PROTONIX) 40 MG tablet Take 1 tablet (40 mg total) by mouth daily. 10/24/19   Lajean Saver, MD  traMADol (ULTRAM) 50 MG tablet Take 1 tablet (50 mg total) by mouth every 6 (six) hours as needed. 10/24/19   Lajean Saver, MD  traMADol (ULTRAM) 50 MG tablet Take 1 tablet (50 mg total) by mouth every 6 (six) hours as needed. 10/24/19   Lajean Saver, MD      Allergies    Patient has no known allergies.    Review of Systems   Review of Systems  Constitutional:  Negative for chills and fever.  HENT:  Negative for ear pain and sore throat.   Eyes:  Negative for pain and visual disturbance.  Respiratory:  Negative for cough and shortness of breath.   Cardiovascular:  Negative for chest pain and palpitations.  Gastrointestinal:  Negative for abdominal pain and vomiting.  Genitourinary:  Negative for dysuria and hematuria.  Musculoskeletal:  Negative for arthralgias and back pain.  Skin:  Negative for color change and rash.  Neurological:  Positive for headaches. Negative for seizures and syncope.  All other systems reviewed and are negative.   Physical Exam Updated Vital Signs BP (!) 158/103   Pulse 91   Temp 98.4 F (36.9  C) (Oral)   Resp 16   Ht 1.6 m (5\' 3" )   Wt 136.1 kg   LMP 11/15/2022   SpO2 99%   BMI 53.14 kg/m  Physical Exam Vitals and nursing note reviewed.  Constitutional:      General: She is not in acute distress.    Appearance: Normal appearance. She is well-developed.  HENT:     Head: Normocephalic and atraumatic.  Eyes:     Extraocular Movements: Extraocular movements intact.     Conjunctiva/sclera: Conjunctivae normal.     Pupils: Pupils are equal, round, and reactive to light.  Cardiovascular:     Rate and Rhythm: Normal rate and regular rhythm.     Heart sounds: No murmur heard. Pulmonary:     Effort: Pulmonary effort is normal.  No respiratory distress.     Breath sounds: Normal breath sounds.  Abdominal:     Palpations: Abdomen is soft.     Tenderness: There is no abdominal tenderness.  Musculoskeletal:        General: No swelling.     Cervical back: Normal range of motion and neck supple.  Skin:    General: Skin is warm and dry.     Capillary Refill: Capillary refill takes less than 2 seconds.  Neurological:     General: No focal deficit present.     Mental Status: She is alert and oriented to person, place, and time.     Cranial Nerves: No cranial nerve deficit.     Sensory: No sensory deficit.     Motor: No weakness.     Coordination: Coordination normal.  Psychiatric:        Mood and Affect: Mood normal.     ED Results / Procedures / Treatments   Labs (all labs ordered are listed, but only abnormal results are displayed) Labs Reviewed - No data to display  EKG EKG Interpretation  Date/Time:  Thursday February 10 2023 11:40:49 EDT Ventricular Rate:  88 PR Interval:  154 QRS Duration: 87 QT Interval:  379 QTC Calculation: 459 R Axis:   -24 Text Interpretation: Sinus rhythm Left ventricular hypertrophy Inferior infarct, old Anterior infarct, old Confirmed by Fredia Sorrow [id number]) on 02/10/2023 11:47:22 AM  Radiology CT Head Wo Contrast  Result Date: 02/10/2023 CLINICAL DATA:  Headache EXAM: CT HEAD WITHOUT CONTRAST TECHNIQUE: Contiguous axial images were obtained from the base of the skull through the vertex without intravenous contrast. RADIATION DOSE REDUCTION: This exam was performed according to the departmental dose-optimization program which includes automated exposure control, adjustment of the mA and/or kV according to patient size and/or use of iterative reconstruction technique. COMPARISON:  CT Head 02/08/23, CT head 06/21/16 FINDINGS: Brain: No evidence of acute infarction, hemorrhage, hydrocephalus, extra-axial collection or mass lesion/mass effect. Enlarged and partially empty sella.  Unchanged hypodensity in the left aspect of the pons (series 2, image 7), which new compared to 2017. Vascular: No hyperdense vessel or unexpected calcification. Skull: Normal. Negative for fracture or focal lesion. Sinuses/Orbits: No middle ear or mastoid effusion. Paranasal sinuses are clear. Orbits are unremarkable. Other: None. IMPRESSION: 1. No hemorrhage. 2. Redemonstrated hypodensity in the left aspect of the pons, which is unchanged compared to 02/08/23, but new compared to 2017. If there is clinical concern for an infarct, further evaluation with a brain MRI is recommended. 3. Enlarged and partially empty sella, which can be seen in the setting of idiopathic intracranial hypertension. Electronically Signed   By: Marin Roberts M.D.   On: 02/10/2023  12:36    Procedures Procedures    Medications Ordered in ED Medications  0.9 %  sodium chloride infusion ( Intravenous Infusion Verify 02/10/23 1352)  sodium chloride 0.9 % bolus 500 mL (0 mLs Intravenous Stopped 02/10/23 1352)  diphenhydrAMINE (BENADRYL) injection 25 mg (25 mg Intravenous Given 02/10/23 1238)  dexamethasone (DECADRON) injection 10 mg (10 mg Intravenous Given 02/10/23 1238)  metoCLOPramide (REGLAN) injection 10 mg (10 mg Intravenous Given 02/10/23 1238)  HYDROmorphone (DILAUDID) injection 1 mg (1 mg Intravenous Given 02/10/23 1239)    ED Course/ Medical Decision Making/ A&P                             Medical Decision Making Amount and/or Complexity of Data Reviewed Radiology: ordered.  Risk Prescription drug management.   We do not have MRI available so we will repeat head CT.  Patient's head CT chart reviewed from [facility] emergency department without any acute findings.  Will also do a migraine cocktail.  Patient does not have a history of migraines or history of chronic headaches.  But will treat with IV fluids hydromorphone Decadron Reglan and Benadryl.  And see what effect it has on her blood pressure.   Patient states she took her blood pressure medicine this morning.  Patient's labs also were normal during the [place] visit.  Do not see any reason to repeat them here today.  With the migraine cocktail patient's headache completely resolved.  However CT head done here today raise concerns about an existing abnormality on the March 26 head CT but nothing new since then.  They saw a redemonstrated hypodensity in the left aspect of the pons which is unchanged compared to March 26 but new compared to 2017.  Discussed with [facility] teleneurology and she recommends patient be sent in for MRI and if it is in a positive for an acute stroke and then for them to be contacted.  Will make arrangements to have patient transferred into [facility] for MRI.  MRI ordered.  CRITICAL CARE Performed by: Fredia Sorrow Total critical care time: 35 minutes Critical care time was exclusive of separately billable procedures and treating other patients. Critical care was necessary to treat or prevent imminent or life-threatening deterioration. Critical care was time spent personally by me on the following activities: development of treatment plan with patient and/or surrogate as well as nursing, discussions with consultants, evaluation of patient's response to treatment, examination of patient, obtaining history from patient or surrogate, ordering and performing treatments and interventions, ordering and review of laboratory studies, ordering and review of radiographic studies, pulse oximetry and re-evaluation of patient's condition.  Excepting ED physician for the MRI is Dr. Truett Mainland.  Final Clinical Impression(s) / ED Diagnoses Final diagnoses:  Primary hypertension  Acute intractable headache, unspecified headache type  Cerebrovascular accident (CVA), unspecified mechanism [facility])    Rx / DC Orders ED Discharge Orders     None         Fredia Sorrow, MD 02/10/23 1429    Fredia Sorrow, MD 02/10/23  1434

## 2023-02-10 NOTE — ED Notes (Signed)
Called [facility] for transport to [facility].  Spoke with [employer]

## 2023-02-10 NOTE — ED Notes (Signed)
Report given to Agricultural consultant at [facility] via phone.

## 2023-02-17 ENCOUNTER — Ambulatory Visit: Payer: BC Managed Care – PPO | Admitting: Diagnostic Neuroimaging

## 2023-02-17 ENCOUNTER — Encounter: Payer: Self-pay | Admitting: Diagnostic Neuroimaging

## 2023-02-17 VITALS — BP 150/90 | HR 81 | Ht 64.0 in | Wt 302.8 lb

## 2023-02-17 DIAGNOSIS — I6789 Other cerebrovascular disease: Secondary | ICD-10-CM

## 2023-02-17 DIAGNOSIS — R519 Headache, unspecified: Secondary | ICD-10-CM

## 2023-02-17 DIAGNOSIS — I161 Hypertensive emergency: Secondary | ICD-10-CM

## 2023-02-17 DIAGNOSIS — H53123 Transient visual loss, bilateral: Secondary | ICD-10-CM | POA: Diagnosis not present

## 2023-02-17 DIAGNOSIS — Z6841 Body Mass Index (BMI) 40.0 and over, adult: Secondary | ICD-10-CM

## 2023-02-17 MED ORDER — TOPIRAMATE 50 MG PO TABS: 50.0000 mg | ORAL_TABLET | Freq: Two times a day (BID) | ORAL | 12 refills | Status: DC

## 2023-02-17 NOTE — Progress Notes (Signed)
[facility]  PATIENT: Anita Montoya DOB: Aug 09, 1981  REFERRING CLINICIAN: Drenda Freeze, MD HISTORY FROM: patient  REASON FOR VISIT: new consult   HISTORICAL  CHIEF COMPLAINT:  Chief Complaint  Patient presents with   New Patient (Initial Visit)    Patient in room #6 and alone. Patient states she been having bad headaches and would like to discuss her poss. of having a stroke.    HISTORY OF PRESENT ILLNESS:   42 year old female here for evaluation of severe headaches and vision loss.  For past 2 weeks patient has had severe frontal throbbing headaches with sensitivity to light and nausea.  On 02/08/2023 patient had 10-minute episode of transient visual obscuration.  She denies tinnitus.  Has history of polycystic ovarian syndrome with long-term obesity.  Weight has been fairly stable.  Has been under more stress lately.  Also having difficult to control blood pressure for the past 1 year.   REVIEW OF SYSTEMS: Full 14 system review of systems performed and negative with exception of: as per HPI.  ALLERGIES: No Known Allergies  HOME MEDICATIONS: Outpatient Medications Prior to Visit  Medication Sig Dispense Refill   albuterol (VENTOLIN HFA) 108 (90 Base) MCG/ACT inhaler Inhale 2 puffs into the lungs every 4 (four) hours as needed.     amLODipine (NORVASC) 10 MG tablet Take 10 mg by mouth daily.     atorvastatin (LIPITOR) 10 MG tablet Take 10 mg by mouth daily.     chlorthalidone (HYGROTON) 25 MG tablet Take 1 tablet (25 mg total) by mouth daily. 30 tablet 5   diclofenac (VOLTAREN) 50 MG EC tablet Take 50 mg by mouth 2 (two) times daily.     lisinopril (PRINIVIL,ZESTRIL) 40 MG tablet Take 1 tablet (40 mg total) by mouth daily. 30 tablet 5   losartan (COZAAR) 25 MG tablet Take 1 tablet (25 mg total) by mouth daily. 30 tablet 0   medroxyPROGESTERone (PROVERA) 10 MG tablet Take 1 tablet (10 mg total) by mouth daily. One tab daily for five days starting on  the first day of the month. Repeat monthly to regulate cycle. 30 tablet 0   meloxicam (MOBIC) 7.5 MG tablet Take 1 tablet (7.5 mg total) by mouth daily. 7 tablet 0   metoCLOPramide (REGLAN) 10 MG tablet Take 1 tablet (10 mg total) by mouth every 6 (six) hours as needed for nausea (nausea/headache). 10 tablet 0   metoprolol succinate (TOPROL-XL) 100 MG 24 hr tablet Take 100 mg by mouth at bedtime.     metoprolol succinate (TOPROL-XL) 50 MG 24 hr tablet Take 50 mg by mouth daily.     pantoprazole (PROTONIX) 40 MG tablet Take 1 tablet (40 mg total) by mouth daily. 30 tablet 0   pantoprazole (PROTONIX) 40 MG tablet Take 1 tablet (40 mg total) by mouth daily. 30 tablet 0   potassium chloride (KLOR-CON) 10 MEQ tablet Take 10 mEq by mouth daily.     traMADol (ULTRAM) 50 MG tablet Take 1 tablet (50 mg total) by mouth every 6 (six) hours as needed. 10 tablet 0   traMADol (ULTRAM) 50 MG tablet Take 1 tablet (50 mg total) by mouth every 6 (six) hours as needed. 10 tablet 0   traZODone (DESYREL) 50 MG tablet Take 50 mg by mouth at bedtime.     No facility-administered medications prior to visit.    PAST MEDICAL HISTORY: Past Medical History:  Diagnosis Date   Duplicated left renal collecting system 2009   noted  on 2009 CT abdomen and pelvis    Herpes simplex    HLD (hyperlipidemia) 08/2013   Hypertension 1995   Menorrhagia 1989   Pancreatitis     PAST SURGICAL HISTORY: Past Surgical History:  Procedure Laterality Date   CHOLECYSTECTOMY     gallbadder removal     uterine poly removed   2009   heavy menstrual bleeding     FAMILY HISTORY: Family History  Problem Relation Age of Onset   Diabetes Father    Heart disease Brother     SOCIAL HISTORY: Social History   Socioeconomic History   Marital status: Single    Spouse name: Not on file   Number of children: Not on file   Years of education: Not on file   Highest education level: Not on file  Occupational History   Not on file   Tobacco Use   Smoking status: Every Day    Packs/day: 0    Types: Cigarettes   Smokeless tobacco: Never  Vaping Use   Vaping Use: Never used  Substance and Sexual Activity   Alcohol use: Yes    Comment: occasional   Drug use: No   Sexual activity: Yes    Birth control/protection: None  Other Topics Concern   Not on file  Social History Narrative   Not on file   Social Determinants of Health   Financial Resource Strain: Not on file  Food Insecurity: Not on file  Transportation Needs: Not on file  Physical Activity: Not on file  Stress: Not on file  Social Connections: Not on file  Intimate Partner Violence: Not on file     PHYSICAL EXAM  GENERAL EXAM/CONSTITUTIONAL: Vitals:  Vitals:   02/17/23 1454  BP: (!) 150/90  Pulse: 81  Weight: (!) 302 lb 12.8 oz (137.3 kg)  Height: 5\' 4"  (1.626 m)   Body mass index is 51.98 kg/m. Wt Readings from Last 10 Encounters:  02/17/23 (!) 302 lb 12.8 oz (137.3 kg)  02/10/23 300 lb (136.1 kg)  10/24/19 294 lb (133.4 kg)  05/20/17 285 lb (129.3 kg)  03/19/15 297 lb (134.7 kg)  03/09/15 300 lb (136.1 kg)  06/10/14 (!) 310 lb (140.6 kg)  06/07/14 (!) 315 lb (142.9 kg)  05/28/14 (!) 315 lb (142.9 kg)  03/31/14 (!) 315 lb (142.9 kg)   Patient is in no distress; well developed, nourished and groomed; neck is supple  CARDIOVASCULAR: Examination of carotid arteries is normal; no carotid bruits Regular rate and rhythm, no murmurs Examination of peripheral vascular system by observation and palpation is normal  EYES: Ophthalmoscopic exam of optic discs and posterior segments is normal; no papilledema or hemorrhages No results found.  MUSCULOSKELETAL: Gait, strength, tone, movements noted in Neurologic exam below  NEUROLOGIC: MENTAL STATUS:      No data to display         awake, alert, oriented to person, place and time recent and remote memory intact normal attention and concentration language fluent, comprehension  intact, naming intact fund of knowledge appropriate  CRANIAL NERVE:  2nd - MILD BLURRED OPTIC DISC MARGINS 2nd, 3rd, 4th, 6th - pupils equal and reactive to light, visual fields full to confrontation, extraocular muscles intact, no nystagmus 5th - facial sensation symmetric 7th - facial strength symmetric 8th - hearing intact 9th - palate elevates symmetrically, uvula midline 11th - shoulder shrug symmetric 12th - tongue protrusion midline  MOTOR:  normal bulk and tone, full strength in the BUE, BLE  SENSORY:  normal and symmetric to light touch, temperature, vibration  COORDINATION:  finger-nose-finger, fine finger movements normal  REFLEXES:  deep tendon reflexes present and symmetric  GAIT/STATION:  narrow based gait     DIAGNOSTIC DATA (LABS, IMAGING, TESTING) - I reviewed patient records, labs, notes, testing and imaging myself where available.  Lab Results  Component Value Date   WBC 9.8 02/10/2023   HGB 15.2 (H) 02/10/2023   HCT 47.4 (H) 02/10/2023   MCV 79.9 (L) 02/10/2023   PLT 455 (H) 02/10/2023      Component Value Date/Time   NA 138 02/10/2023 1620   K 4.0 02/10/2023 1620   CL 104 02/10/2023 1620   CO2 24 02/10/2023 1620   GLUCOSE 116 (H) 02/10/2023 1620   BUN 10 02/10/2023 1620   CREATININE 0.83 02/10/2023 1620   CREATININE 0.69 12/15/2013 1100   CALCIUM 8.9 02/10/2023 1620   PROT 7.4 03/09/2015 2305   ALBUMIN 3.5 03/09/2015 2305   AST 14 03/09/2015 2305   ALT 19 03/09/2015 2305   ALKPHOS 93 03/09/2015 2305   BILITOT 0.4 03/09/2015 2305   GFRNONAA >60 02/10/2023 1620   GFRAA >90 03/09/2015 2305   Lab Results  Component Value Date   CHOL 178 12/15/2013   HDL 44 12/15/2013   LDLCALC 115 (H) 12/15/2013   TRIG 94 12/15/2013   CHOLHDL 4.0 12/15/2013   Lab Results  Component Value Date   HGBA1C 5.90 03/19/2015   No results found for: "VITAMINB12" Lab Results  Component Value Date   TSH 1.024 12/15/2013    02/10/23 MRI brain [I  reviewed images myself and agree with interpretation. -VRP]  1. No acute intracranial process. 2. Chronic infarct of the pons. 3. Multiple small subcortical and periventricular T2/FLAIR hyperintense lesions, which in the absence of history of demyelinating disease are favored to represents changes related to chronic microvascular ischemic change.    ASSESSMENT AND PLAN  41 y.o. year old female here with:   Dx:  1. New onset of headaches   2. Transient visual loss of both eyes   3. Hypertensive emergency   4. Cerebral microvascular disease   5. BMI 50.0-59.9, adult       PLAN:  NEW ONSET HEADACHES (since March 2024; with transient visual obscuration on 02/08/23; concern for idiopathic intracranial hypertension (pseudotumor cerebri); other ddx includes hypertensive emergency and migraine with aura) - LP evaluation - ophthalmology referral - medical weight mgmt referral (BMI > 50; PCOS) - start topiramate 50mg  at bedtime x 1 week; then 50mg  twice a day; drink plenty of water - follow up BP control with PCP  Orders Placed This Encounter  Procedures   DG [place]   Ambulatory referral to Ophthalmology   Ambulatory referral to [facility]    Meds ordered this encounter  Medications   topiramate (TOPAMAX) 50 MG tablet    Sig: Take 1 tablet (50 mg total) by mouth 2 (two) times daily.    Dispense:  60 tablet    Refill:  12   Return in about 3 months (around 05/19/2023).  I reviewed images, labs, notes, records myself. I summarized findings and reviewed with patient, for this high risk condition (transient vision loss; severe headaches) requiring high complexity decision making.    Penni Bombard, MD [id number], [id number] PM Certified in Neurology, Neurophysiology and Neuroimaging  [facility] [address], [place], [place] [address] [phone]

## 2023-02-18 ENCOUNTER — Encounter (INDEPENDENT_AMBULATORY_CARE_PROVIDER_SITE_OTHER): Payer: BC Managed Care – PPO | Admitting: Podiatry

## 2023-02-18 ENCOUNTER — Ambulatory Visit
Admission: RE | Admit: 2023-02-18 | Discharge: 2023-02-18 | Disposition: A | Discharge status: Home / Self Care | Payer: BC Managed Care – PPO | Source: Ambulatory Visit | Attending: Diagnostic Neuroimaging | Admitting: Diagnostic Neuroimaging

## 2023-02-18 DIAGNOSIS — Z6841 Body Mass Index (BMI) 40.0 and over, adult: Secondary | ICD-10-CM

## 2023-02-18 DIAGNOSIS — H53123 Transient visual loss, bilateral: Secondary | ICD-10-CM

## 2023-02-18 DIAGNOSIS — M79671 Pain in right foot: Secondary | ICD-10-CM

## 2023-02-18 DIAGNOSIS — R519 Headache, unspecified: Secondary | ICD-10-CM

## 2023-02-18 NOTE — Discharge Instructions (Signed)

## 2023-02-21 ENCOUNTER — Telehealth: Payer: Self-pay | Admitting: Diagnostic Neuroimaging

## 2023-02-21 MED ORDER — ACETAZOLAMIDE 250 MG PO TABS: ORAL_TABLET | ORAL | 0 refills | Status: DC

## 2023-02-21 NOTE — Progress Notes (Signed)
Error

## 2023-02-21 NOTE — Telephone Encounter (Signed)
Called the patient back. Pt states that last night had episode where she blacked out. This is something new for her. Patient states that she was well hydrated and there were no other symptoms.  This morning states that all of a sudden she developed a numbness localized to the left hand and it was like her left hand contractured inward. This lasted for 30 min. States the numbness is gone and she can straighten her hand out. States that she has no hx of seizures. Denies headache. Denies any other symptoms occurring at the same time. She has grip strength and states she can touch her thumb with all of her fingers.  Advised I would pass along this information to Dr Marjory Lies. Informed her I would reach out with what his thoughts and recommendations may be. Advised ill have him review results of the LP as well

## 2023-02-21 NOTE — Telephone Encounter (Signed)
Pt called stated she had LB puncture of 4/5. Stated she black-out last night for about five minutes and this morning her left had turn in for about five minutes or more. Stated her hand isn't completely straightened back out, stated it's like she having a stroke. Informed pt she need to follow-up with ER if she think she's having a stroke. I informed pt nurses has 24-48 to return a call.

## 2023-02-21 NOTE — Telephone Encounter (Signed)
Received fax that [facility] is booking out to July, sent referral to [facility] instead [phone].

## 2023-02-21 NOTE — Telephone Encounter (Signed)
Urgent referral sent to [facility], phone # [phone].

## 2023-02-21 NOTE — Telephone Encounter (Signed)
Called the patient back and advised Dr Marjory Lies recommends that in reference to the black out/syncope spells she should reach out to PCP. In regards to the LP there was elevated opening pressure and he would recommend switching the topiramate to diamox for the patient. Advised the patient MD recommends she starts the medication 250 mg twice a day for 7 days and then increase to 500 mg twice a day. Pt verbalized understanding and was appreciative for the call back.

## 2023-02-22 LAB — GLUCOSE, CSF: Glucose, CSF: 72 mg/dL (ref 40–80)

## 2023-02-22 LAB — CSF CULTURE W GRAM STAIN
GRAM STAIN:: NONE SEEN
MICRO NUMBER:: 14788157
Result:: NO GROWTH
SPECIMEN QUALITY:: ADEQUATE

## 2023-02-22 LAB — PROTEIN, CSF: Total Protein, CSF: 39 mg/dL (ref 15–45)

## 2023-02-22 LAB — CSF CELL COUNT WITH DIFFERENTIAL
RBC Count, CSF: 0 cells/uL
TOTAL NUCLEATED CELL: 1 cells/uL (ref 0–5)

## 2023-02-28 ENCOUNTER — Ambulatory Visit (INDEPENDENT_AMBULATORY_CARE_PROVIDER_SITE_OTHER): Payer: BC Managed Care – PPO | Admitting: Nurse Practitioner

## 2023-02-28 ENCOUNTER — Encounter: Payer: Self-pay | Admitting: Nurse Practitioner

## 2023-02-28 VITALS — BP 122/92 | HR 89 | Temp 98.2°F | Ht 64.0 in | Wt 295.0 lb

## 2023-02-28 DIAGNOSIS — I10 Essential (primary) hypertension: Secondary | ICD-10-CM

## 2023-02-28 DIAGNOSIS — Z6841 Body Mass Index (BMI) 40.0 and over, adult: Secondary | ICD-10-CM

## 2023-02-28 DIAGNOSIS — E782 Mixed hyperlipidemia: Secondary | ICD-10-CM | POA: Diagnosis not present

## 2023-02-28 DIAGNOSIS — G932 Benign intracranial hypertension: Secondary | ICD-10-CM | POA: Diagnosis not present

## 2023-02-28 DIAGNOSIS — G90A Postural orthostatic tachycardia syndrome (POTS): Secondary | ICD-10-CM | POA: Insufficient documentation

## 2023-02-28 NOTE — Progress Notes (Signed)
Office: [phone]  /  Fax: [phone]   Initial Visit  Anita Montoya was seen in clinic today to evaluate for obesity. She is interested in losing weight to improve overall health and reduce the risk of weight related complications. She presents today to review program treatment options, initial physical assessment, and evaluation.     She was referred by: Specialist  When asked what else they would like to accomplish? She states: Improve existing medical conditions, Reduce number of medications, and Lose a target amount of weight : <200 lbs  Weight history:  She has always struggled with her weight. Her weight has fluctuated over the years.    When asked how has your weight affected you? She states: Has affected self-esteem, Relationships, Contributed to medical problems, Contributed to orthopedic problems or mobility issues, Having fatigue, Having poor endurance, Problems with eating patterns, and Problems with depression and or anxiety  Some associated conditions: HTN, IIH, POTS, acute pancreatitis due to gallstones, PCOS, hirsutism, depression and anxiety    Contributing factors: Family history, Medications, Stress, Life event, and Pregnancy  Weight promoting medications identified: Beta-blockers, Steroids, and Contraceptives or hormonal therapy  Current nutrition plan: None  Current level of physical activity: None  Current or previous pharmacotherapy: None  She has tried OTC weight loss meds  Response to medication: Never tried medications   Past medical history includes:   Past Medical History:  Diagnosis Date   Duplicated left renal collecting system 2009   noted on 2009 CT abdomen and pelvis    Herpes simplex    HLD (hyperlipidemia) 08/2013   Hypertension 1995   Menorrhagia 1989   Pancreatitis      Objective:   BP (!) 122/92   Pulse 89   Temp 98.2 F (36.8 C)   Ht  (1.626 m)   Wt 295 lb (133.8 kg)   LMP 11/15/2022   SpO2 98%   BMI 50.64 kg/m   She was weighed on the bioimpedance scale: Body mass index is 50.64 kg/m.  Peak Weight:308 lbs , Body Fat%:53.9, Visceral Fat Rating:19, Weight trend over the last 12 months: Increasing  General:  Alert, oriented and cooperative. Patient is in no acute distress.  Respiratory: Normal respiratory effort, no problems with respiration noted   Gait: able to ambulate independently  Mental Status: Normal mood and affect. Normal behavior. Normal judgment and thought content.   DIAGNOSTIC DATA REVIEWED:  BMET    Component Value Date/Time   NA 138 02/10/2023 1620   K 4.0 02/10/2023 1620   CL 104 02/10/2023 1620   CO2 24 02/10/2023 1620   GLUCOSE 116 (H) 02/10/2023 1620   BUN 10 02/10/2023 1620   CREATININE 0.83 02/10/2023 1620   CREATININE 0.69 12/15/2013 1100   CALCIUM 8.9 02/10/2023 1620   GFRNONAA >60 02/10/2023 1620   GFRAA >90 03/09/2015 2305   Lab Results  Component Value Date   HGBA1C 5.90 03/19/2015   No results found for: "INSULIN" CBC    Component Value Date/Time   WBC 9.8 02/10/2023 1620   RBC 5.93 (H) 02/10/2023 1620   HGB 15.2 (H) 02/10/2023 1620   HCT 47.4 (H) 02/10/2023 1620   PLT 455 (H) 02/10/2023 1620   MCV 79.9 (L) 02/10/2023 1620   MCH 25.6 (L) 02/10/2023 1620   MCHC 32.1 02/10/2023 1620   RDW 14.6 02/10/2023 1620   Iron/TIBC/Ferritin/ %Sat No results found for: "IRON", "TIBC", "FERRITIN", "IRONPCTSAT" Lipid Panel     Component Value Date/Time   CHOL  178 12/15/2013 1100   TRIG 94 12/15/2013 1100   HDL 44 12/15/2013 1100   CHOLHDL 4.0 12/15/2013 1100   VLDL 19 12/15/2013 1100   LDLCALC 115 (H) 12/15/2013 1100   Hepatic Function Panel     Component Value Date/Time   PROT 7.4 03/09/2015 2305   ALBUMIN 3.5 03/09/2015 2305   AST 14 03/09/2015 2305   ALT 19 03/09/2015 2305   ALKPHOS 93 03/09/2015 2305   BILITOT 0.4 03/09/2015 2305   BILIDIR 0.1 05/28/2008 2110   IBILI 0.4 05/28/2008 2110      Component Value Date/Time   TSH 1.024 12/15/2013  1100     Assessment and Plan:   Essential hypertension, benign Continue to follow up with PCP.  Continue meds as directed.   IIH (idiopathic intracranial hypertension) Continue to follow up with neurology.  Continue meds as directed.   Mixed hyperlipidemia Continue to follow up with PCP.  Continue meds as directed.   Morbid obesity  BMI 50.0-59.9, adult        Obesity Treatment / Action Plan:  Patient will work on garnering support from family and friends to begin weight loss journey. Will work on eliminating or reducing the presence of highly palatable, calorie dense foods in the home. Will complete provided nutritional and psychosocial assessment questionnaire before the next appointment. Will be scheduled for indirect calorimetry to determine resting energy expenditure in a fasting state.  This will allow [place] to create a reduced calorie, high-protein meal plan to promote loss of fat mass while preserving muscle mass.  Obesity Education Performed Today:  She was weighed on the bioimpedance scale and results were discussed and documented in the synopsis.  We discussed obesity as a disease and the importance of a more detailed evaluation of all the factors contributing to the disease.  We discussed the importance of long term lifestyle changes which include nutrition, exercise and behavioral modifications as well as the importance of customizing this to her specific health and social needs.  We discussed the benefits of reaching a healthier weight to alleviate the symptoms of existing conditions and reduce the risks of the biomechanical, metabolic and psychological effects of obesity.  Anita Montoya appears to be in the action stage of change and states they are ready to start intensive lifestyle modifications and behavioral modifications.  30 minutes was spent today on this visit including the above counseling, pre-visit chart review, and post-visit documentation.  Reviewed  by clinician on day of visit: allergies, medications, problem list, medical history, surgical history, family history, social history, and previous encounter notes pertinent to obesity diagnosis.    Theodis Sato Domonik Levario FNP-C

## 2023-03-02 ENCOUNTER — Telehealth: Payer: Self-pay | Admitting: Diagnostic Neuroimaging

## 2023-03-02 NOTE — Telephone Encounter (Signed)
Pt was prescribed  traZODone (DESYREL) 50 MG tablet and she is having problems getting it filled due to misunderstandings with the Pharmacy [facility] on [address]

## 2023-03-02 NOTE — Telephone Encounter (Signed)
Contacted pharmacy, they stated patient already picked up Diamox today. Nothing was needed

## 2023-03-02 NOTE — Telephone Encounter (Signed)
Pt called back. Stated she is at the pharmacy and medication acetaZOLAMIDE (DIAMOX) 250 MG tablet is saying she need a clinical review.

## 2023-03-02 NOTE — Telephone Encounter (Signed)
Called pt back. Informed her of message [facility] sent, Please advise pt we did not prescribe Trazodone, we prescribed Topiramate for headaches. She needs to contact prescribing office. Pt said so many people just prescribing stuff and I'm confused to who is doing what. PT said thank for calling me back.

## 2023-03-02 NOTE — Telephone Encounter (Signed)
Please advise pt we did not prescribe Trazodone, we prescribed Topiramate for headaches. She needs to contact prescribing office.

## 2023-03-29 ENCOUNTER — Other Ambulatory Visit: Payer: Self-pay | Admitting: Diagnostic Neuroimaging

## 2023-03-30 ENCOUNTER — Other Ambulatory Visit: Payer: Self-pay

## 2023-03-31 ENCOUNTER — Other Ambulatory Visit: Payer: Self-pay | Admitting: Neurology

## 2023-09-12 ENCOUNTER — Encounter (HOSPITAL_BASED_OUTPATIENT_CLINIC_OR_DEPARTMENT_OTHER): Payer: Self-pay

## 2023-09-12 ENCOUNTER — Emergency Department (HOSPITAL_BASED_OUTPATIENT_CLINIC_OR_DEPARTMENT_OTHER): Payer: 59

## 2023-09-12 ENCOUNTER — Observation Stay (HOSPITAL_BASED_OUTPATIENT_CLINIC_OR_DEPARTMENT_OTHER)
Admission: EM | Admit: 2023-09-12 | Discharge: 2023-09-14 | Disposition: A | Discharge status: Home / Self Care | Payer: 59 | Attending: Internal Medicine | Admitting: Internal Medicine

## 2023-09-12 ENCOUNTER — Other Ambulatory Visit: Payer: Self-pay

## 2023-09-12 DIAGNOSIS — Z79899 Other long term (current) drug therapy: Secondary | ICD-10-CM | POA: Insufficient documentation

## 2023-09-12 DIAGNOSIS — I6340 Cerebral infarction due to embolism of unspecified cerebral artery: Secondary | ICD-10-CM

## 2023-09-12 DIAGNOSIS — Z8249 Family history of ischemic heart disease and other diseases of the circulatory system: Secondary | ICD-10-CM | POA: Diagnosis not present

## 2023-09-12 DIAGNOSIS — E66813 Obesity, class 3: Secondary | ICD-10-CM | POA: Diagnosis not present

## 2023-09-12 DIAGNOSIS — G4733 Obstructive sleep apnea (adult) (pediatric): Secondary | ICD-10-CM | POA: Diagnosis not present

## 2023-09-12 DIAGNOSIS — G932 Benign intracranial hypertension: Secondary | ICD-10-CM | POA: Diagnosis not present

## 2023-09-12 DIAGNOSIS — K118 Other diseases of salivary glands: Secondary | ICD-10-CM

## 2023-09-12 DIAGNOSIS — I639 Cerebral infarction, unspecified: Principal | ICD-10-CM

## 2023-09-12 DIAGNOSIS — F17210 Nicotine dependence, cigarettes, uncomplicated: Secondary | ICD-10-CM | POA: Insufficient documentation

## 2023-09-12 DIAGNOSIS — J9611 Chronic respiratory failure with hypoxia: Secondary | ICD-10-CM | POA: Insufficient documentation

## 2023-09-12 DIAGNOSIS — Z23 Encounter for immunization: Secondary | ICD-10-CM | POA: Diagnosis not present

## 2023-09-12 DIAGNOSIS — I10 Essential (primary) hypertension: Secondary | ICD-10-CM | POA: Insufficient documentation

## 2023-09-12 DIAGNOSIS — R200 Anesthesia of skin: Secondary | ICD-10-CM | POA: Diagnosis present

## 2023-09-12 DIAGNOSIS — E876 Hypokalemia: Secondary | ICD-10-CM | POA: Diagnosis not present

## 2023-09-12 LAB — URINALYSIS, MICROSCOPIC (REFLEX)

## 2023-09-12 LAB — COMPREHENSIVE METABOLIC PANEL
ALT: 16 U/L (ref 0–44)
AST: 14 U/L — ABNORMAL LOW (ref 15–41)
Albumin: 3.5 g/dL (ref 3.5–5.0)
Alkaline Phosphatase: 77 U/L (ref 38–126)
Anion gap: 9 (ref 5–15)
BUN: 9 mg/dL (ref 6–20)
CO2: 25 mmol/L (ref 22–32)
Calcium: 8.6 mg/dL — ABNORMAL LOW (ref 8.9–10.3)
Chloride: 104 mmol/L (ref 98–111)
Creatinine, Ser: 0.67 mg/dL (ref 0.44–1.00)
GFR, Estimated: >60 mL/min (ref >60)
Glucose, Bld: 119 mg/dL — ABNORMAL HIGH (ref 70–99)
Potassium: 3.1 mmol/L — ABNORMAL LOW (ref 3.5–5.1)
Sodium: 138 mmol/L (ref 135–145)
Total Bilirubin: 0.3 mg/dL (ref 0.3–1.2)
Total Protein: 7.7 g/dL (ref 6.5–8.1)

## 2023-09-12 LAB — RAPID URINE DRUG SCREEN, HOSP PERFORMED
Amphetamines: NOT DETECTED
Barbiturates: NOT DETECTED
Benzodiazepines: NOT DETECTED
Cocaine: NOT DETECTED
Opiates: NOT DETECTED
Tetrahydrocannabinol: NOT DETECTED

## 2023-09-12 LAB — URINALYSIS, ROUTINE W REFLEX MICROSCOPIC
Bilirubin Urine: NEGATIVE
Glucose, UA: NEGATIVE mg/dL
Ketones, ur: NEGATIVE mg/dL
Nitrite: NEGATIVE
Protein, ur: NEGATIVE mg/dL
Specific Gravity, Urine: 1.025 (ref 1.005–1.030)
pH: 7 (ref 5.0–8.0)

## 2023-09-12 LAB — APTT: aPTT: 27 s (ref 24–36)

## 2023-09-12 LAB — DIFFERENTIAL
Abs Immature Granulocytes: 0.03 10*3/uL (ref 0.00–0.07)
Basophils Absolute: 0.1 10*3/uL (ref 0.0–0.1)
Basophils Relative: 1 %
Eosinophils Absolute: 0.7 10*3/uL — ABNORMAL HIGH (ref 0.0–0.5)
Eosinophils Relative: 5 %
Immature Granulocytes: 0 %
Lymphocytes Relative: 37 %
Lymphs Abs: 5.2 10*3/uL — ABNORMAL HIGH (ref 0.7–4.0)
Monocytes Absolute: 0.9 10*3/uL (ref 0.1–1.0)
Monocytes Relative: 7 %
Neutro Abs: 7 10*3/uL (ref 1.7–7.7)
Neutrophils Relative %: 50 %

## 2023-09-12 LAB — CBC
HCT: 43.3 % (ref 36.0–46.0)
Hemoglobin: 14.3 g/dL (ref 12.0–15.0)
MCH: 26.2 pg (ref 26.0–34.0)
MCHC: 33 g/dL (ref 30.0–36.0)
MCV: 79.4 fL — ABNORMAL LOW (ref 80.0–100.0)
Platelets: 473 10*3/uL — ABNORMAL HIGH (ref 150–400)
RBC: 5.45 MIL/uL — ABNORMAL HIGH (ref 3.87–5.11)
RDW: 15.2 % (ref 11.5–15.5)
WBC: 13.9 10*3/uL — ABNORMAL HIGH (ref 4.0–10.5)
nRBC: 0 % (ref 0.0–0.2)

## 2023-09-12 LAB — PROTIME-INR
INR: 0.9 (ref 0.8–1.2)
Prothrombin Time: 12.3 s (ref 11.4–15.2)

## 2023-09-12 LAB — PREGNANCY, URINE: Preg Test, Ur: NEGATIVE

## 2023-09-12 LAB — CBG MONITORING, ED: Glucose-Capillary: 128 mg/dL — ABNORMAL HIGH (ref 70–99)

## 2023-09-12 MED ORDER — IOHEXOL 350 MG/ML SOLN
75.0000 mL | Freq: Once | INTRAVENOUS | Status: AC | PRN
  Administered 2023-09-12: 75 mL via INTRAVENOUS

## 2023-09-12 MED ORDER — POTASSIUM CHLORIDE CRYS ER 20 MEQ PO TBCR
40.0000 meq | EXTENDED_RELEASE_TABLET | Freq: Once | ORAL | Status: DC
  Filled 2023-09-12: qty 2

## 2023-09-12 NOTE — ED Triage Notes (Signed)
Pt reports right hand numbness that has been intermittent since about 3pm today associated with episodes of forgetfulness. Currently she feels "alright" but the right hand numbness is still present. Denies weakness. Pt speaking clear complete sentences. A&OX4. Ambulatory with independent steady gait.

## 2023-09-12 NOTE — ED Notes (Signed)
ED Provider at bedside. 

## 2023-09-12 NOTE — ED Provider Notes (Signed)
[place] [facility] AT [facility] Provider Note   CSN: [id number] Arrival date & time: 09/12/23  2045     History {Add pertinent medical, surgical, social history, OB history to HPI:1} Chief Complaint  Patient presents with   Numbness    Anita Montoya is a 42 y.o. female idiopathic intracranial hypertension, postural orthostatic tachycardia syndrome, hypertension presenting with right hand numbness and weakness that has been present since 3 PM today.  Patient has associated episodes of feeling forgetful.  Patient reports that she was walking out to her car when she forgot where she was going, family member  HPI     Home Medications Prior to Admission medications   Medication Sig Start Date End Date Taking? Authorizing Provider  acetaZOLAMIDE ER (DIAMOX) 500 MG capsule Take 1 capsule (500 mg total) by mouth 2 (two) times daily. 03/31/23   Penumalli, Glenford Bayley, MD  albuterol (VENTOLIN HFA) 108 (90 Base) MCG/ACT inhaler Inhale 2 puffs into the lungs every 4 (four) hours as needed. 12/19/21   [provider]  amLODipine (NORVASC) 10 MG tablet Take 10 mg by mouth daily.    [provider]  atorvastatin (LIPITOR) 10 MG tablet Take 10 mg by mouth daily.    [provider]  chlorthalidone (HYGROTON) 25 MG tablet Take 1 tablet (25 mg total) by mouth daily. 03/19/15   Funches, Gerilyn Nestle, MD  diclofenac (VOLTAREN) 50 MG EC tablet Take 50 mg by mouth 2 (two) times daily.    [provider]  lisinopril (PRINIVIL,ZESTRIL) 40 MG tablet Take 1 tablet (40 mg total) by mouth daily. 03/19/15   Funches, Gerilyn Nestle, MD  losartan (COZAAR) 25 MG tablet Take 1 tablet (25 mg total) by mouth daily. 02/10/23   Charlynne Pander, MD  losartan (COZAAR) 50 MG tablet Take 1 tablet by mouth daily. 02/25/23 02/25/24  [provider]  medroxyPROGESTERone (PROVERA) 10 MG tablet Take 1 tablet (10 mg total) by mouth daily. One tab daily for five days starting on the  first day of the month. Repeat monthly to regulate cycle. 12/15/13   Funches, Gerilyn Nestle, MD  meloxicam (MOBIC) 7.5 MG tablet Take 1 tablet (7.5 mg total) by mouth daily. 03/10/15   Palumbo, April, MD  metoCLOPramide (REGLAN) 10 MG tablet Take 1 tablet (10 mg total) by mouth every 6 (six) hours as needed for nausea (nausea/headache). 02/10/23   Charlynne Pander, MD  metoprolol succinate (TOPROL-XL) 100 MG 24 hr tablet Take 100 mg by mouth at bedtime.    [provider]  metoprolol succinate (TOPROL-XL) 50 MG 24 hr tablet Take 50 mg by mouth daily. 03/12/22   [provider]  pantoprazole (PROTONIX) 40 MG tablet Take 1 tablet (40 mg total) by mouth daily. 10/24/19   Cathren Laine, MD  pantoprazole (PROTONIX) 40 MG tablet Take 1 tablet (40 mg total) by mouth daily. 10/24/19   Cathren Laine, MD  potassium chloride (KLOR-CON) 10 MEQ tablet Take 10 mEq by mouth daily. 03/12/22   [provider]  traMADol (ULTRAM) 50 MG tablet Take 1 tablet (50 mg total) by mouth every 6 (six) hours as needed. 10/24/19   Cathren Laine, MD  traMADol (ULTRAM) 50 MG tablet Take 1 tablet (50 mg total) by mouth every 6 (six) hours as needed. 10/24/19   Cathren Laine, MD  traZODone (DESYREL) 50 MG tablet Take 50 mg by mouth at bedtime.    [provider]      Allergies    Patient has  no known allergies.    Review of Systems   Review of Systems  Physical Exam Updated Vital Signs BP (!) 193/124 (BP Location: Left Arm)   Pulse (!) 102   Temp 99 F (37.2 C) (Oral)   Resp 20   Wt (!) 140.2 kg   LMP 09/11/2022 (Approximate)   SpO2 97%   BMI 53.04 kg/m  Physical Exam  ED Results / Procedures / Treatments   Labs (all labs ordered are listed, but only abnormal results are displayed) Labs Reviewed  CBG MONITORING, ED - Abnormal; Notable for the following components:      Result Value   Glucose-Capillary 128 (*)    All other components within normal limits    EKG EKG  Interpretation Date/Time:  Monday September 12 2023 20:58:30 EDT Ventricular Rate:  96 PR Interval:  150 QRS Duration:  84 QT Interval:  372 QTC Calculation: 469 R Axis:   -12  Text Interpretation: Normal sinus rhythm Moderate voltage criteria for LVH, may be normal variant ( R in aVL , Cornell product ) Inferior infarct , age undetermined Anterior infarct , age undetermined Confirmed by Vivi Barrack [phone]) on 09/12/2023 9:16:39 PM  Radiology No results found.  Procedures Procedures  {Document cardiac monitor, telemetry assessment procedure when appropriate:1}  Medications Ordered in ED Medications - No data to display  ED Course/ Medical Decision Making/ A&P   {   Click here for ABCD2, HEART and other calculatorsREFRESH Note before signing :1}                              Medical Decision Making Amount and/or Complexity of Data Reviewed Labs: ordered. Radiology: ordered.   ***  {Document critical care time when appropriate:1} {Document review of labs and clinical decision tools ie heart score, Chads2Vasc2 etc:1}  {Document your independent review of radiology images, and any outside records:1} {Document your discussion with family members, caretakers, and with consultants:1} {Document social determinants of health affecting pt's care:1} {Document your decision making why or why not admission, treatments were needed:1} Final Clinical Impression(s) / ED Diagnoses Final diagnoses:  None    Rx / DC Orders ED Discharge Orders     None

## 2023-09-12 NOTE — ED Notes (Signed)
Asher Muir, PA at bedside to evaluate the patient.

## 2023-09-13 ENCOUNTER — Emergency Department (HOSPITAL_COMMUNITY): Payer: 59

## 2023-09-13 ENCOUNTER — Encounter (HOSPITAL_COMMUNITY): Payer: Self-pay | Admitting: Internal Medicine

## 2023-09-13 ENCOUNTER — Other Ambulatory Visit: Payer: Self-pay

## 2023-09-13 ENCOUNTER — Observation Stay (HOSPITAL_BASED_OUTPATIENT_CLINIC_OR_DEPARTMENT_OTHER): Payer: 59

## 2023-09-13 ENCOUNTER — Observation Stay (HOSPITAL_COMMUNITY): Payer: 59

## 2023-09-13 DIAGNOSIS — I6389 Other cerebral infarction: Secondary | ICD-10-CM

## 2023-09-13 DIAGNOSIS — K118 Other diseases of salivary glands: Secondary | ICD-10-CM | POA: Diagnosis not present

## 2023-09-13 DIAGNOSIS — I639 Cerebral infarction, unspecified: Principal | ICD-10-CM

## 2023-09-13 LAB — LIPID PANEL
Cholesterol: 154 mg/dL (ref 0–200)
HDL: 43 mg/dL (ref >40)
LDL Cholesterol: 102 mg/dL — ABNORMAL HIGH (ref 0–99)
Total CHOL/HDL Ratio: 3.6 {ratio}
Triglycerides: 47 mg/dL (ref <150)
VLDL: 9 mg/dL (ref 0–40)

## 2023-09-13 LAB — ECHOCARDIOGRAM COMPLETE BUBBLE STUDY
Area-P 1/2: 2.76 cm2
S' Lateral: 3.3 cm

## 2023-09-13 LAB — HEMOGLOBIN A1C
Hgb A1c MFr Bld: 5.9 % — ABNORMAL HIGH (ref 4.8–5.6)
Mean Plasma Glucose: 122.63 mg/dL

## 2023-09-13 LAB — HIV ANTIBODY (ROUTINE TESTING W REFLEX): HIV Screen 4th Generation wRfx: NONREACTIVE

## 2023-09-13 LAB — MAGNESIUM: Magnesium: 2 mg/dL (ref 1.7–2.4)

## 2023-09-13 MED ORDER — FENTANYL CITRATE PF 50 MCG/ML IJ SOSY
50.0000 ug | PREFILLED_SYRINGE | Freq: Once | INTRAMUSCULAR | Status: AC
  Administered 2023-09-13: 50 ug via INTRAVENOUS
  Filled 2023-09-13: qty 1

## 2023-09-13 MED ORDER — CLOPIDOGREL BISULFATE 75 MG PO TABS
75.0000 mg | ORAL_TABLET | Freq: Every day | ORAL | Status: DC
  Administered 2023-09-13 – 2023-09-14 (×2): 75 mg via ORAL
  Filled 2023-09-13 (×2): qty 1

## 2023-09-13 MED ORDER — ACETAMINOPHEN 650 MG RE SUPP: 650.0000 mg | Freq: Four times a day (QID) | RECTAL | Status: DC | PRN

## 2023-09-13 MED ORDER — LISINOPRIL 20 MG PO TABS: 40.0000 mg | ORAL_TABLET | Freq: Once | ORAL | Status: DC

## 2023-09-13 MED ORDER — LOSARTAN POTASSIUM 50 MG PO TABS
25.0000 mg | ORAL_TABLET | Freq: Once | ORAL | Status: AC
  Administered 2023-09-13: 25 mg via ORAL
  Filled 2023-09-13: qty 1

## 2023-09-13 MED ORDER — ROSUVASTATIN CALCIUM 20 MG PO TABS
40.0000 mg | ORAL_TABLET | Freq: Every day | ORAL | Status: DC
  Administered 2023-09-13 – 2023-09-14 (×2): 40 mg via ORAL
  Filled 2023-09-13 (×2): qty 2

## 2023-09-13 MED ORDER — METOPROLOL SUCCINATE ER 25 MG PO TB24
100.0000 mg | ORAL_TABLET | Freq: Once | ORAL | Status: AC
  Administered 2023-09-13: 100 mg via ORAL
  Filled 2023-09-13: qty 4

## 2023-09-13 MED ORDER — ACETAZOLAMIDE ER 500 MG PO CP12
500.0000 mg | ORAL_CAPSULE | Freq: Two times a day (BID) | ORAL | Status: DC
  Administered 2023-09-14: 500 mg via ORAL
  Filled 2023-09-13 (×3): qty 1

## 2023-09-13 MED ORDER — IOHEXOL 350 MG/ML SOLN
75.0000 mL | Freq: Once | INTRAVENOUS | Status: AC | PRN
  Administered 2023-09-13: 75 mL via INTRAVENOUS

## 2023-09-13 MED ORDER — INFLUENZA VIRUS VACC SPLIT PF (FLUZONE) 0.5 ML IM SUSY
0.5000 mL | PREFILLED_SYRINGE | INTRAMUSCULAR | Status: AC
  Administered 2023-09-14: 0.5 mL via INTRAMUSCULAR
  Filled 2023-09-13: qty 0.5

## 2023-09-13 MED ORDER — PERFLUTREN LIPID MICROSPHERE
1.0000 mL | INTRAVENOUS | Status: AC | PRN
  Administered 2023-09-13: 4 mL via INTRAVENOUS

## 2023-09-13 MED ORDER — ACETAMINOPHEN 325 MG PO TABS
650.0000 mg | ORAL_TABLET | Freq: Four times a day (QID) | ORAL | Status: DC | PRN
Start: 2023-09-13 — End: 2023-09-14

## 2023-09-13 MED ORDER — CLOPIDOGREL BISULFATE 75 MG PO TABS
225.0000 mg | ORAL_TABLET | Freq: Once | ORAL | Status: AC
  Administered 2023-09-13: 225 mg via ORAL
  Filled 2023-09-13: qty 3

## 2023-09-13 MED ORDER — SENNOSIDES-DOCUSATE SODIUM 8.6-50 MG PO TABS: 1.0000 | ORAL_TABLET | Freq: Every evening | ORAL | Status: DC | PRN

## 2023-09-13 MED ORDER — PNEUMOCOCCAL 20-VAL CONJ VACC 0.5 ML IM SUSY
0.5000 mL | PREFILLED_SYRINGE | INTRAMUSCULAR | Status: AC
  Administered 2023-09-14: 0.5 mL via INTRAMUSCULAR
  Filled 2023-09-13: qty 0.5

## 2023-09-13 MED ORDER — ENOXAPARIN SODIUM 80 MG/0.8ML IJ SOSY
70.0000 mg | PREFILLED_SYRINGE | INTRAMUSCULAR | Status: DC
  Administered 2023-09-13 – 2023-09-14 (×2): 70 mg via SUBCUTANEOUS
  Filled 2023-09-13: qty 0.8
  Filled 2023-09-13: qty 0.7

## 2023-09-13 MED ORDER — ASPIRIN 81 MG PO CHEW
81.0000 mg | CHEWABLE_TABLET | Freq: Every day | ORAL | Status: DC
Start: 2023-09-13 — End: 2023-09-14
  Administered 2023-09-13 – 2023-09-14 (×2): 81 mg via ORAL
  Filled 2023-09-13 (×2): qty 1

## 2023-09-13 MED ORDER — LORAZEPAM 2 MG/ML IJ SOLN
1.0000 mg | Freq: Once | INTRAMUSCULAR | Status: AC
  Administered 2023-09-13: 1 mg via INTRAVENOUS
  Filled 2023-09-13: qty 1

## 2023-09-13 NOTE — ED Notes (Signed)
Neurology provider at bedside.

## 2023-09-13 NOTE — ED Notes (Signed)
Patient transported to CT 

## 2023-09-13 NOTE — Progress Notes (Signed)
Echocardiogram 2D Echocardiogram has been performed.  Warren Lacy Treina Arscott RDCS 09/13/2023, 2:00 PM

## 2023-09-13 NOTE — ED Notes (Signed)
Patient transported back to MRI.   

## 2023-09-13 NOTE — Evaluation (Signed)
Physical Therapy Evaluation Patient Details Name: Anita Montoya MRN: [id number] DOB: [birth year] Today's Date: 09/13/2023  History of Present Illness  Pt is a 42 y/o female admitted to the ED 10/28 with Right-sided hand and arm numbness. MRI showed an acute stroke in the left frontal/parietal cortex with a chronic lacunar infarct in the left pons.  PMHx:  HTN, cholecytectomy  Clinical Impression  Pt is at or close to baseline functioning and should be safe at home with PRN assist.   Pt's symptoms have largely resolved. There are no further acute PT needs.  Will sign off at this time.         If plan is discharge home, recommend the following:  (PRN assist)   Can travel by private vehicle        Equipment Recommendations None recommended by PT  Recommendations for Other Services       Functional Status Assessment Patient has had a recent decline in their functional status and demonstrates the ability to make significant improvements in function in a reasonable and predictable amount of time.     Precautions / Restrictions Precautions Precautions:  (low fall risk)      Mobility  Bed Mobility Overal bed mobility: Modified Independent                  Transfers Overall transfer level: Modified independent                      Ambulation/Gait Ambulation/Gait assistance: Modified independent (Device/Increase time) Gait Distance (Feet): 200 Feet Assistive device: None Gait Pattern/deviations: Step-through pattern   Gait velocity interpretation: 1.31 - 2.62 ft/sec, indicative of limited community ambulator   General Gait Details: stable gait pattern.  No deviations with scanning and directional changes, No LOB.  Stairs Stairs: Yes Stairs assistance: Modified independent (Device/Increase time) Stair Management: One rail Right, Alternating pattern, Forwards Number of Stairs: 5 General stair comments: safe with the rail  Wheelchair Mobility     Tilt  Bed    Modified Rankin (Stroke Patients Only) Modified Rankin (Stroke Patients Only) Pre-Morbid Rankin Score: No symptoms Modified Rankin: Slight disability     Balance Overall balance assessment: Modified Independent, No apparent balance deficits (not formally assessed)                                           Pertinent Vitals/Pain Pain Assessment Pain Assessment: No/denies pain    Home Living Family/patient expects to be discharged to:: Private residence Living Arrangements: Parent;Other relatives Available Help at Discharge: Family;Available 24 hours/day Type of Home: House Home Access: Stairs to enter Entrance Stairs-Rails: Doctor, general practice of Steps: 2/3   Home Layout: Able to live on main level with bedroom/bathroom;Other (Comment) (kitchen and bath down 2 steps) Home Equipment: BSC/3in1;Rolling Walker (2 wheels);Cane - single point;Grab bars - tub/shower      Prior Function Prior Level of Function : Independent/Modified Independent;Driving                     Extremity/Trunk Assessment   Upper Extremity Assessment Upper Extremity Assessment:  (R UE grip and gross ext/flex weaker than L UE)    Lower Extremity Assessment Lower Extremity Assessment: Overall WFL for tasks assessed (No focal deficits, mild weakness R side, but L Knee affects strength left side.)    Cervical / Trunk Assessment Cervical / Trunk  Assessment: Normal  Communication   Communication Communication: No apparent difficulties  Cognition Arousal: Alert Behavior During Therapy: WFL for tasks assessed/performed Overall Cognitive Status: Within Functional Limits for tasks assessed                                          General Comments General comments (skin integrity, edema, etc.): Discussed with pt/family, finding out pt's risk factors, try to make some change.  Went over BE FAST signs of stroke.    Exercises      Assessment/Plan    PT Assessment Patient does not need any further PT services  PT Problem List         PT Treatment Interventions      PT Goals (Current goals can be found in the Care Plan section)  Acute Rehab PT Goals PT Goal Formulation: All assessment and education complete, DC therapy    Frequency       Co-evaluation               AM-PAC PT "6 Clicks" Mobility  Outcome Measure Help needed turning from your back to your side while in a flat bed without using bedrails?: None Help needed moving from lying on your back to sitting on the side of a flat bed without using bedrails?: None Help needed moving to and from a bed to a chair (including a wheelchair)?: None Help needed standing up from a chair using your arms (e.g., wheelchair or bedside chair)?: None Help needed to walk in hospital room?: None Help needed climbing 3-5 steps with a railing? : None 6 Click Score: 24    End of Session   Activity Tolerance: Patient tolerated treatment well Patient left: in bed;with family/visitor present;with call bell/phone within reach Nurse Communication: Mobility status PT Visit Diagnosis: Other symptoms and signs involving the nervous system (R29.898)    Time: 6387-5643 PT Time Calculation (min) (ACUTE ONLY): 20 min   Charges:   PT Evaluation $PT Eval Low Complexity: 1 Low   PT General Charges $$ ACUTE PT VISIT: 1 Visit         09/13/2023  Jacinto Halim., PT Acute Rehabilitation Services [phone]  (office)  Anita Montoya 09/13/2023, 5:38 PM

## 2023-09-13 NOTE — ED Notes (Signed)
Pt return from MRI, MRI was not completed. Dr. Manus Gunning notified.

## 2023-09-13 NOTE — ED Provider Notes (Signed)
Patient transferred from outside facility for MRI of brain.  She is here with right hand numbness and "forgetfulness" ongoing since 3 PM.  CT head was negative.  CTA was nondiagnostic due to contrast timing but no obvious large vessel occlusion.  Patient made aware of parotid nodule need for follow-up.  MRI attempted to take patient but she refused study stating that she was "too tight" and felt like she was unable to complete the scans.  She states she does not think she can even do it with medications.  She is requesting exam under anesthesia.  Discussed with patient that this is not feasible through the ED.  She would require admission for this and it is unlikely that anesthesia will agree to sedate her for the study given her minimal deficits.  She is not a candidate for any kind of stroke intervention.  Discussed With Dr. Otelia Limes of neurology who agrees.  He recommends attempting MRI again with pain and anxiety medications. Consider alternative diagnoses such as MS as well.  Patient was able to tolerate MRI brain and C-spine with some sedating medications but could not tolerate MRV or MRA.  1. 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  2. Chronic lacunar infarct in the left pons.   Cervical MRI:   Unremarkable.  Normal cord signal.   Results as above.  Will discuss with neurology.  Discussed with internal medicine residents who will admit patient.    Glynn Octave, MD 09/13/23 [phone]

## 2023-09-13 NOTE — ED Notes (Signed)
Imaging called regarding MRI scan that was blurry due to PT movement r/t sedation. Notified Neurology NP at bedside, NP indicated that they were able to discern information from MRI despite lack of clarity in picture.

## 2023-09-13 NOTE — H&P (Signed)
Date: 09/13/2023               Patient Name:  Anita Montoya MRN: [id number]  DOB: 1981-02-08 Age / Sex: 42 y.o., female   PCP: Burnis Medin, PA-C         Medical Service: Internal Medicine Teaching Service         Attending Physician: Dr. Inez Catalina, MD       *Please do not leave messages for physician in this sticky note. Please page appropriate physician as below.*   Weekday Hours (7AM-5PM):   First Contact: Domenic Schwab        Pager: [phone]        Second Contact: Marrianne Mood, MD    Pager: MM [phone]    ** If no return call within 15 minutes (after trying both pagers listed above), please call after hours pagers.   After 5 pm or weekends:  1st Contact: Pager: [phone]  2nd Contact: Pager: [phone]  Chief Complaint: numbness/forgetfulness  History of Present Illness:  Pt is a 42 year old female with past medical hx of HTN, HLD, Class III obesity, Idiopathic Intracranial HTN, OSA, chronic hypoxic respiratory failure who presented to the ED after having right sided hand and arm numbness that started yesterday. She states she had an episode of forgetfulness yesterday that started acutely. This and the numbness started at the same time. She denied having any other symptoms at that time.   She states her symptoms have improved to where her mentation is back to baseline and her numbness has improved from constant to intermittent.  Pt reports overall her blood pressure is hard to control and she is taking medication but they are not working.   Review of Systems negative unless stated in the HPI.  In the ED, MRI obtained that showed acute stroke in left frontal parietal cortex and chronic lacunar infarct in the left pons. Neurology and IMTS consulted for further management.   Past Medical History: Past Medical History:  Diagnosis Date   Duplicated left renal collecting system 2009   noted on 2009 CT abdomen and pelvis    Herpes simplex     HLD (hyperlipidemia) 08/2013   Hypertension 1995   Menorrhagia 1989   Pancreatitis    Meds: Recent fill only on spironolactone  Current Outpatient Medications  Medication Instructions   albuterol (VENTOLIN HFA) 108 (90 Base) MCG/ACT inhaler 2 puffs, Inhalation, Every 4 hours PRN   amLODipine (NORVASC) 10 mg, Oral, Daily   atorvastatin (LIPITOR) 10 mg, Daily   chlorthalidone (HYGROTON) 25 mg, Oral, Daily   diclofenac (VOLTAREN) 50 mg, Oral, 2 times daily   losartan (COZAAR) 100 mg, Oral, Daily   meloxicam (MOBIC) 7.5 mg, Oral, Daily   metoCLOPramide (REGLAN) 10 mg, Oral, Every 6 hours PRN   metoprolol succinate (TOPROL-XL) 100 mg, Oral, Nightly   pantoprazole (PROTONIX) 40 mg, Oral, Daily   spironolactone (ALDACTONE) 50 mg, Oral, Daily   traZODone (DESYREL) 50 mg, Oral, Nightly  Acetazolamide 250 mg Tablet  Allergies: Allergies as of 09/12/2023   (No Known Allergies)    Past Surgical History: Past Surgical History:  Procedure Laterality Date   CHOLECYSTECTOMY     gallbadder removal     uterine poly removed   2009   heavy menstrual bleeding     Family History:  Family History  Problem Relation Age of Onset   Diabetes Father    Heart disease Brother    Social History:  Lives with  mother and father.  Currently not working. Tobacco- smoking 1.5 ppd x 20 years. Alcohol use: occasional alcohol use.  Illicit drug use- denies use.  IADLs/ADLs- can person independently at baseline   Physical Exam: Blood pressure (!) 140/81, pulse 77, temperature 98.2 F (36.8 C), temperature source Oral, resp. rate 15, weight (!) 140.2 kg, last menstrual period 09/11/2022, SpO2 100%. General: NAD HENT: NCAT Lungs: CTAB Cardiovascular: NSR, good radial pulses, no LE edema  Abdomen: No TTP of abdomen MSK: no asymmetry Skin: no lesions on exposed skin Neuro: alert and oriented x4, CNII-XII intact, good shoulder shrug, upper and lower extremity strength 5/5 and sensation intact  bilaterally. Psych: normal mood and normal affect  Diagnostics:     Latest Ref Rng & Units 09/12/2023    9:05 PM 02/10/2023    4:20 PM 03/09/2015   11:05 PM  CBC  WBC 4.0 - 10.5 K/uL 13.9  9.8  13.1   Hemoglobin 12.0 - 15.0 g/dL 16.1  09.6  04.5   Hematocrit 36.0 - 46.0 % 43.3  47.4  42.7   Platelets 150 - 400 K/uL 473  455  388        Latest Ref Rng & Units 09/12/2023    9:05 PM 02/10/2023    4:20 PM 03/09/2015   11:05 PM  CMP  Glucose 70 - 99 mg/dL 409  811  91   BUN 6 - 20 mg/dL 9  10  15    Creatinine 0.44 - 1.00 mg/dL 9.14  7.82  9.56   Sodium 135 - 145 mmol/L 138  138  138   Potassium 3.5 - 5.1 mmol/L 3.1  4.0  3.1   Chloride 98 - 111 mmol/L 104  104  106   CO2 22 - 32 mmol/L 25  24  25    Calcium 8.9 - 10.3 mg/dL 8.6  8.9  8.6   Total Protein 6.5 - 8.1 g/dL 7.7   7.4   Total Bilirubin 0.3 - 1.2 mg/dL 0.3   0.4   Alkaline Phos 38 - 126 U/L 77   93   AST 15 - 41 U/L 14   14   ALT 0 - 44 U/L 16   19     MR BRAIN WO CONTRAST  Result Date: 09/13/2023 CLINICAL DATA:  Acute cervical spine myelopathy. Stroke with arm weakness. EXAM: MRI HEAD WITHOUT CONTRAST MRI CERVICAL SPINE WITHOUT CONTRAST TECHNIQUE: Multiplanar, multiecho pulse sequences of the brain and surrounding structures, and cervical spine, to include the craniocervical junction and cervicothoracic junction, were obtained without intravenous contrast. COMPARISON:  Brain MRI 02/10/2023  IMPRESSION: Brain MRI: 1. 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 2. Chronic lacunar infarct in the left pons. Cervical MRI: Unremarkable.  Normal cord signal. Electronically Signed   By: Tiburcio Pea M.D.   On: 09/13/2023 06:43   MR Cervical Spine Wo Contrast  Result Date: 09/13/2023 CLINICAL DATA:  Acute cervical spine myelopathy. Stroke with arm weakness. EXAM: MRI HEAD WITHOUT CONTRAST MRI CERVICAL SPINE WITHOUT CONTRAST TECHNIQUE: Multiplanar, multiecho pulse sequences of the brain and surrounding  structures, and cervical spine, to include the craniocervical junction and cervicothoracic junction, were obtained without intravenous contrast. COMPARISON:  Brain MRI 02/10/2023  IMPRESSION: Brain MRI: 1. 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 2. Chronic lacunar infarct in the left pons. Cervical MRI: Unremarkable.  Normal cord signal. Electronically Signed   By: Tiburcio Pea M.D.   On: 09/13/2023 06:43   CT ANGIO  HEAD NECK W WO CM  Result Date: 09/13/2023 CLINICAL DATA:  Right hand numbness EXAM: CT ANGIOGRAPHY HEAD AND NECK WITH AND WITHOUT CONTRAST TECHNIQUE: Multidetector CT imaging of the head and neck was performed using the standard protocol during bolus administration of intravenous contrast. Multiplanar CT image reconstructions and MIPs were obtained to evaluate the vascular anatomy. Carotid stenosis measurements (when applicable) are obtained utilizing NASCET criteria, using the distal internal carotid diameter as the denominator. RADIATION DOSE REDUCTION: This exam was performed according to the departmental dose-optimization program which includes automated exposure control, adjustment of the mA and/or kV according to patient size and/or use of iterative reconstruction technique. CONTRAST:  75mL OMNIPAQUE IOHEXOL 350 MG/ML SOLN COMPARISON:  None Available.  IMPRESSION: 1. Severely limited assessment of the vessels due to poor opacification. The bolus may have triggered inaccurately due to artifacts caused by body habitus. Within that limitation, there is no proximal large vessel occlusion. 2. No acute intracranial abnormality. 3. 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 Consider histologic sampling, as benign and malignant parotid lesions may have the same appearance. Electronically Signed   By: Deatra Robinson M.D.   On: 09/13/2023 01:02     EKG: personally reviewed my interpretation is NSR with  LVH  Assessment & Plan by Problem:  Cerebral Infarction Pt with acute neurological complaints found to have stroke on imaging. Neurology consulted. Will start ASA 81 mg daily and plavix 75 mg daily for 21 days, Crestor 40 mg started. Previously on lipitor 10 mg. Lipid panel and A1c ordered. Echo with bubble study ordered. Will get PT/OT for this patient. Unable to access OP records to see assessment of the PCP on pt's chronic medical problem but pt reports difficulty controlling her BP. Will allow permissive HTN for this pt. Losartan and metoprolol given in ED prior to MRI results but will hold for now. Appears last fill dates on her chronic medication was in 01/2023  Hypokalemia Checking magnesium and repleting both as needed for goal K>4 and Mag>2 given structural heart disease.   Parotid Nodule:  1.2 cm left parotid nodule with some cystic features. 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 Will need OP follow up. Will likely need biopsy given her smoking hx and cystic appearance of the lesion.   Chronic Problems: Chronic Hypoxic Respiratory Failure At home on 2 L while ambulating. Currently at room air. No acute exacerbation. Follows with pulmonology OP. Uses albuterol as needed. PFTs in 2023 did not show any obstructive disease. Continue OP pulm follow p with weaning off oxygen as able.   Idiopathic Intracranial HTN: Seen by Dr. Marjory Lies. LP showed elevated pressures. Was started on azetazolamide for this but pt has not had any recent refills of this. She did not recall this medication when it was discussed with her. Will hold this medication in this acute setting given it can alter CPP but will start after discussing with neurology.   OSA: AHI score of 55.3. Will order nightly CPAP with 14 cm H2O pressure setting.   HTN: Permissive HTN for now. Slowly start based on echo findings but likely with ARB. Suspect resistant to control 2/2 to severe OSA.   MDD: On trazodone 50 mg nightly.  Will continue here.   DVT prophx: Lovenox Diet: NPO Bowel: PRN Code: Full  Prior to Admission Living Arrangement: Home Anticipated Discharge Location: Home Barriers to Discharge: Medical Workup  Dispo: Admit patient to  Observation with expected length of stay less than 2 midnights.  Gwenevere Abbot, MD Eligha Bridegroom. [facility] Internal Medicine Residency, PGY-3 Pager: [phone]

## 2023-09-13 NOTE — Progress Notes (Signed)
   09/13/23 2319  BiPAP/CPAP/SIPAP  $ Non-Invasive Home Ventilator  Initial  $ Face Mask Medium Yes  BiPAP/CPAP/SIPAP Pt Type Adult  BiPAP/CPAP/SIPAP Resmed  Mask Type Full face mask  Mask Size Medium  Respiratory Rate 18 breaths/min  EPAP  (8,20)  FiO2 (%) 21 %  BiPAP/CPAP /SiPAP Vitals  Pulse Rate 80  Resp 18  SpO2 96 %  MEWS Score/Color  MEWS Score 0  MEWS Score Color [place] Si

## 2023-09-13 NOTE — ED Notes (Signed)
Patient transported to MRI 

## 2023-09-13 NOTE — Progress Notes (Signed)
MRI was attempted, pt states it is too tight and she will need anesthesia to complete exams, notified RN and MD.

## 2023-09-13 NOTE — ED Notes (Signed)
Neurology at bedside.

## 2023-09-13 NOTE — ED Notes (Signed)
Pt return from MRI

## 2023-09-13 NOTE — ED Provider Notes (Incomplete)
[facility] Provider Note   CSN: [id number] Arrival date & time: 09/12/23  2045     History {Add pertinent medical, surgical, social history, OB history to HPI:1} Chief Complaint  Patient presents with  . Numbness    Anita Montoya is a 42 y.o. female idiopathic intracranial hypertension, postural orthostatic tachycardia syndrome, hypertension presenting with right hand numbness and weakness that has been present since 3 PM today.  Patient has associated episodes of feeling forgetful.  Patient reports that she was walking out to her car when she forgot where she was going, family member  HPI     Home Medications Prior to Admission medications   Medication Sig Start Date End Date Taking? Authorizing Provider  acetaZOLAMIDE ER (DIAMOX) 500 MG capsule Take 1 capsule (500 mg total) by mouth 2 (two) times daily. 03/31/23   Penumalli, Glenford Bayley, MD  albuterol (VENTOLIN HFA) 108 (90 Base) MCG/ACT inhaler Inhale 2 puffs into the lungs every 4 (four) hours as needed. 12/19/21   [provider]  amLODipine (NORVASC) 10 MG tablet Take 10 mg by mouth daily.    [provider]  atorvastatin (LIPITOR) 10 MG tablet Take 10 mg by mouth daily.    [provider]  chlorthalidone (HYGROTON) 25 MG tablet Take 1 tablet (25 mg total) by mouth daily. 03/19/15   Funches, Gerilyn Nestle, MD  diclofenac (VOLTAREN) 50 MG EC tablet Take 50 mg by mouth 2 (two) times daily.    [provider]  lisinopril (PRINIVIL,ZESTRIL) 40 MG tablet Take 1 tablet (40 mg total) by mouth daily. 03/19/15   Funches, Gerilyn Nestle, MD  losartan (COZAAR) 25 MG tablet Take 1 tablet (25 mg total) by mouth daily. 02/10/23   Charlynne Pander, MD  losartan (COZAAR) 50 MG tablet Take 1 tablet by mouth daily. 02/25/23 02/25/24  [provider]  medroxyPROGESTERone (PROVERA) 10 MG tablet Take 1 tablet (10 mg total) by mouth daily. One tab daily for five days starting on the  first day of the month. Repeat monthly to regulate cycle. 12/15/13   Funches, Gerilyn Nestle, MD  meloxicam (MOBIC) 7.5 MG tablet Take 1 tablet (7.5 mg total) by mouth daily. 03/10/15   Palumbo, April, MD  metoCLOPramide (REGLAN) 10 MG tablet Take 1 tablet (10 mg total) by mouth every 6 (six) hours as needed for nausea (nausea/headache). 02/10/23   Charlynne Pander, MD  metoprolol succinate (TOPROL-XL) 100 MG 24 hr tablet Take 100 mg by mouth at bedtime.    [provider]  metoprolol succinate (TOPROL-XL) 50 MG 24 hr tablet Take 50 mg by mouth daily. 03/12/22   [provider]  pantoprazole (PROTONIX) 40 MG tablet Take 1 tablet (40 mg total) by mouth daily. 10/24/19   Cathren Laine, MD  pantoprazole (PROTONIX) 40 MG tablet Take 1 tablet (40 mg total) by mouth daily. 10/24/19   Cathren Laine, MD  potassium chloride (KLOR-CON) 10 MEQ tablet Take 10 mEq by mouth daily. 03/12/22   [provider]  traMADol (ULTRAM) 50 MG tablet Take 1 tablet (50 mg total) by mouth every 6 (six) hours as needed. 10/24/19   Cathren Laine, MD  traMADol (ULTRAM) 50 MG tablet Take 1 tablet (50 mg total) by mouth every 6 (six) hours as needed. 10/24/19   Cathren Laine, MD  traZODone (DESYREL) 50 MG tablet Take 50 mg by mouth at bedtime.    [provider]      Allergies    Patient has  no known allergies.    Review of Systems   Review of Systems  Physical Exam Updated Vital Signs BP (!) 193/124 (BP Location: Left Arm)   Pulse (!) 102   Temp 99 F (37.2 C) (Oral)   Resp 20   Wt (!) 140.2 kg   LMP 09/11/2022 (Approximate)   SpO2 97%   BMI 53.04 kg/m  Physical Exam  ED Results / Procedures / Treatments   Labs (all labs ordered are listed, but only abnormal results are displayed) Labs Reviewed  CBG MONITORING, ED - Abnormal; Notable for the following components:      Result Value   Glucose-Capillary 128 (*)    All other components within normal limits    EKG EKG  Interpretation Date/Time:  Monday September 12 2023 20:58:30 EDT Ventricular Rate:  96 PR Interval:  150 QRS Duration:  84 QT Interval:  372 QTC Calculation: 469 R Axis:   -12  Text Interpretation: Normal sinus rhythm Moderate voltage criteria for LVH, may be normal variant ( R in aVL , [employer] product ) Inferior infarct , age undetermined Anterior infarct , age undetermined Confirmed by Vivi Barrack [phone]) on 09/12/2023 9:16:39 PM  Radiology No results found.  Procedures Procedures  {Document cardiac monitor, telemetry assessment procedure when appropriate:1}  Medications Ordered in ED Medications - No data to display  ED Course/ Medical Decision Making/ A&P Clinical Course as of 09/13/23 0001  Mon Sep 12, 2023  2259 Spoke with neurology, would like to be called after radiology has read imaging. [phone] [JB]    Clinical Course User Index [JB] Devinne Epstein, Horald Chestnut, PA-C   {   Click here for ABCD2, HEART and other calculatorsREFRESH Note before signing :1}                              Medical Decision Making Amount and/or Complexity of Data Reviewed Labs: ordered. Radiology: ordered.  Risk Prescription drug management.   [name] 42 y.o. presented today for right hand weakness and numbness. Working Ddx: tension headache, migraine, intracranial mass, intracranial hemorrhage, intracranial infection including meningitis vs encephalitis, trigeminal neuralgia, AVM, sinusitis, cerebral aneurysm, muscular headache, cavernous sinus thrombosis, carotid artery dissection.   PMHX: HTN  Review of prior external notes: ***  Unique Tests and My Interpretation:  CBC: 13.9  CMP: *** UA: *** UPT: *** CT Head w/o Contrast: ***  Problem List / ED Course / Critical interventions / Medication management  *** I ordered medication including ***  for ***  Reevaluation of the patient after these medicines showed that the patient  {resolved/improved/worsened:23923::"improved"} Patients vitals assessed. Upon arrival patient is *** hemodynamically stable.  I have reviewed the patients home medicines and have made adjustments as needed  Discussion with Independent Historian: {historian:29369}  Discussion of Management of Tests: {historian:29369}   Plan:   F/u w/ PCP in 2-3d to ensure resolution of sx.  Patient was given return precautions. Patient stable for discharge at this time.  Patient educated on sx/dx and verbalized understanding of plan. Return to ED if new or worsening sx.     {Document critical care time when appropriate:1} {Document review of labs and clinical decision tools ie heart score, Chads2Vasc2 etc:1}  {Document your independent review of radiology images, and any outside records:1} {Document your discussion with family members, caretakers, and with consultants:1} {Document social determinants of health affecting pt's care:1} {Document your decision making why or why not admission, treatments were  needed:1} Final Clinical Impression(s) / ED Diagnoses Final diagnoses:  None    Rx / DC Orders ED Discharge Orders     None

## 2023-09-13 NOTE — ED Notes (Signed)
Pt's IV secured. Pt aware to go directly to [facility] ED.

## 2023-09-13 NOTE — ED Notes (Signed)
ED TO INPATIENT HANDOFF REPORT  ED Nurse Name and Phone #: Percival Spanish [phone]  S Name/Age/Gender Anita Montoya 42 y.o. female Room/Bed: 037C/037C  Code Status   Code Status: Full Code  Home/SNF/Other Home Patient oriented to: self, place, time, and situation Is this baseline? Yes   Triage Complete: Triage complete  Chief Complaint Cerebral infarction [facility]) [I63.9]  Triage Note Pt reports right hand numbness that has been intermittent since about 3pm today associated with episodes of forgetfulness. Currently she feels "alright" but the right hand numbness is still present. Denies weakness. Pt speaking clear complete sentences. A&OX4. Ambulatory with independent steady gait.    Allergies No Known Allergies  Level of Care/Admitting Diagnosis ED Disposition     ED Disposition  Admit   Condition  --   Comment  Hospital Area: [facility] [[id number]]  Level of Care: Telemetry Medical [[id number]]  May place patient in observation at [facility] or [facility] if equivalent level of care is available:: No  Covid Evaluation: Asymptomatic - no recent exposure (last 10 days) testing not required  Diagnosis: Cerebral infarction [facility]) [[id number]]  Admitting Physician: Gwenevere Abbot [[id number]]  Attending Physician: Ninetta Lights, JEFFREY C [[id number]]          B Medical/Surgery History Past Medical History:  Diagnosis Date   Duplicated left renal collecting system 2009   noted on 2009 CT abdomen and pelvis    Herpes simplex    HLD (hyperlipidemia) 08/2013   Hypertension 1995   Menorrhagia 1989   Pancreatitis    Past Surgical History:  Procedure Laterality Date   CHOLECYSTECTOMY     gallbadder removal     uterine poly removed   2009   heavy menstrual bleeding      A IV Location/Drains/Wounds Patient Lines/Drains/Airways Status     Active Line/Drains/Airways     Name Placement date Placement time Site Days   Peripheral IV 09/12/23 20 G 1.16" Right Antecubital 09/12/23   2138  Antecubital  1            Intake/Output Last 24 hours No intake or output data in the 24 hours ending 09/13/23 1404  Labs/Imaging Results for orders placed or performed during the hospital encounter of 09/12/23 (from the past 48 hour(s))  CBG monitoring, ED     Status: Abnormal   Collection Time: 09/12/23  8:54 PM  Result Value Ref Range   Glucose-Capillary 128 (H) 70 - 99 mg/dL    Comment: Glucose reference range applies only to samples taken after fasting for at least 8 hours.   Comment 1 Notify RN   Protime-INR     Status: None   Collection Time: 09/12/23  9:05 PM  Result Value Ref Range   Prothrombin Time 12.3 11.4 - 15.2 seconds   INR 0.9 0.8 - 1.2    Comment: (NOTE) INR goal varies based on device and disease states. Performed at [facility], [address] [place], [place] [address]   APTT     Status: None   Collection Time: 09/12/23  9:05 PM  Result Value Ref Range   aPTT 27 24 - 36 seconds    Comment: Performed at [facility], [address] [place], [place] [address]  CBC     Status: Abnormal   Collection Time: 09/12/23  9:05 PM  Result Value Ref Range   WBC 13.9 (H) 4.0 - 10.5 K/uL   RBC 5.45 (H) 3.87 - 5.11 MIL/uL  Hemoglobin 14.3 12.0 - 15.0 g/dL   HCT 16.1 09.6 - 04.5 %   MCV 79.4 (L) 80.0 - 100.0 fL   MCH 26.2 26.0 - 34.0 pg   MCHC 33.0 30.0 - 36.0 g/dL   RDW 40.9 81.1 - 91.4 %   Platelets 473 (H) 150 - 400 K/uL   nRBC 0.0 0.0 - 0.2 %    Comment: Performed at [facility], [address] [place], [place] [address]  Differential     Status: Abnormal   Collection Time: 09/12/23  9:05 PM  Result Value Ref Range   Neutrophils Relative % 50 %   Neutro Abs 7.0 1.7 - 7.7 K/uL   Lymphocytes Relative 37 %   Lymphs Abs 5.2 (H) 0.7 - 4.0 K/uL   Monocytes Relative 7 %   Monocytes Absolute 0.9 0.1 - 1.0 K/uL   Eosinophils Relative 5 %   Eosinophils Absolute 0.7 (H) 0.0 - 0.5 K/uL   Basophils Relative 1 %    Basophils Absolute 0.1 0.0 - 0.1 K/uL   Immature Granulocytes 0 %   Abs Immature Granulocytes 0.03 0.00 - 0.07 K/uL    Comment: Performed at [facility], [address] [place], [place] [address]  Comprehensive metabolic panel     Status: Abnormal   Collection Time: 09/12/23  9:05 PM  Result Value Ref Range   Sodium 138 135 - 145 mmol/L   Potassium 3.1 (L) 3.5 - 5.1 mmol/L   Chloride 104 98 - 111 mmol/L   CO2 25 22 - 32 mmol/L   Glucose, Bld 119 (H) 70 - 99 mg/dL    Comment: Glucose reference range applies only to samples taken after fasting for at least 8 hours.   BUN 9 6 - 20 mg/dL   Creatinine, Ser 8.65 0.44 - 1.00 mg/dL   Calcium 8.6 (L) 8.9 - 10.3 mg/dL   Total Protein 7.7 6.5 - 8.1 g/dL   Albumin 3.5 3.5 - 5.0 g/dL   AST 14 (L) 15 - 41 U/L   ALT 16 0 - 44 U/L   Alkaline Phosphatase 77 38 - 126 U/L   Total Bilirubin 0.3 0.3 - 1.2 mg/dL   GFR, Estimated >78 >46 mL/min    Comment: (NOTE) Calculated using the CKD-EPI Creatinine Equation (2021)    Anion gap 9 5 - 15    Comment: Performed at [facility], [address] [place], [place] [address]  Urine rapid drug screen (hosp performed)     Status: None   Collection Time: 09/12/23  9:05 PM  Result Value Ref Range   Opiates NONE DETECTED NONE DETECTED   Cocaine NONE DETECTED NONE DETECTED   Benzodiazepines NONE DETECTED NONE DETECTED   Amphetamines NONE DETECTED NONE DETECTED   Tetrahydrocannabinol NONE DETECTED NONE DETECTED   Barbiturates NONE DETECTED NONE DETECTED    Comment: (NOTE) DRUG SCREEN FOR MEDICAL PURPOSES ONLY.  IF CONFIRMATION IS NEEDED FOR ANY PURPOSE, NOTIFY LAB WITHIN 5 DAYS.  LOWEST DETECTABLE LIMITS FOR URINE DRUG SCREEN Drug Class                     Cutoff (ng/mL) Amphetamine and metabolites    1000 Barbiturate and metabolites    200 Benzodiazepine                 200 Opiates and metabolites        300 Cocaine and metabolites  300 THC                             50 Performed at [facility], [address] [place], [place] [address]   Urinalysis, Routine w reflex microscopic -Urine, Clean Catch     Status: Abnormal   Collection Time: 09/12/23  9:05 PM  Result Value Ref Range   Color, Urine YELLOW YELLOW   APPearance HAZY (A) CLEAR   Specific Gravity, Urine 1.025 1.005 - 1.030   pH 7.0 5.0 - 8.0   Glucose, UA NEGATIVE NEGATIVE mg/dL   Hgb urine dipstick TRACE (A) NEGATIVE   Bilirubin Urine NEGATIVE NEGATIVE   Ketones, ur NEGATIVE NEGATIVE mg/dL   Protein, ur NEGATIVE NEGATIVE mg/dL   Nitrite NEGATIVE NEGATIVE   Leukocytes,Ua TRACE (A) NEGATIVE    Comment: Performed at [facility], [address] [place], [place] [address]  Pregnancy, urine     Status: None   Collection Time: 09/12/23  9:05 PM  Result Value Ref Range   Preg Test, Ur NEGATIVE NEGATIVE    Comment:        THE SENSITIVITY OF THIS METHODOLOGY IS >25 mIU/mL. Performed at [facility], [address] [place], [place] [address]   Urinalysis, Microscopic (reflex)     Status: Abnormal   Collection Time: 09/12/23  9:05 PM  Result Value Ref Range   RBC / HPF 0-5 0 - 5 RBC/hpf   WBC, UA 6-10 0 - 5 WBC/hpf   Bacteria, UA RARE (A) NONE SEEN   Squamous Epithelial / HPF 6-10 0 - 5 /HPF    Comment: Performed at [facility], [address] [place], [place] [address]  HIV Antibody (routine testing w rflx)     Status: None   Collection Time: 09/13/23  8:50 AM  Result Value Ref Range   HIV Screen 4th Generation wRfx Non Reactive Non Reactive    Comment: Performed at [facility], [address] [place], [place] [address]  Hemoglobin A1c     Status: Abnormal   Collection Time: 09/13/23  8:50 AM  Result Value Ref Range   Hgb A1c MFr Bld 5.9 (H) 4.8 - 5.6 %    Comment: (NOTE) Pre diabetes:          5.7%-6.4%  Diabetes:              >6.4%  Glycemic control for   <7.0% adults with diabetes    Mean Plasma Glucose 122.63 mg/dL     Comment: Performed at [facility], [address] [place], [place] [address]  Lipid panel     Status: Abnormal   Collection Time: 09/13/23  8:50 AM  Result Value Ref Range   Cholesterol 154 0 - 200 mg/dL   Triglycerides 47 <578 mg/dL   HDL 43 >46 mg/dL   Total CHOL/HDL Ratio 3.6 RATIO   VLDL 9 0 - 40 mg/dL   LDL Cholesterol 962 (H) 0 - 99 mg/dL    Comment:        Total Cholesterol/HDL:CHD Risk Coronary Heart Disease Risk Table                     Men   Women  1/2 Average Risk   3.4   3.3  Average Risk       5.0   4.4  2  X Average Risk   9.6   7.1  3 X Average Risk  23.4   11.0        Use the calculated Patient Ratio above and the CHD Risk Table to determine the patient's CHD Risk.        ATP III CLASSIFICATION (LDL):  <100     mg/dL   Optimal  846-962  mg/dL   Near or Above                    Optimal  130-159  mg/dL   Borderline  952-841  mg/dL   High  >324     mg/dL   Very High Performed at [facility], [address], [place], [place] [address]    MR BRAIN WO CONTRAST  Result Date: 09/13/2023 CLINICAL DATA:  Acute cervical spine myelopathy. Stroke with arm weakness. EXAM: MRI HEAD WITHOUT CONTRAST MRI CERVICAL SPINE WITHOUT CONTRAST TECHNIQUE: Multiplanar, multiecho pulse sequences of the brain and surrounding structures, and cervical spine, to include the craniocervical junction and cervicothoracic junction, were obtained without intravenous contrast. COMPARISON:  Brain MRI 02/10/2023 FINDINGS: MRI HEAD FINDINGS Brain: Moderate area of restricted diffusion centered at the left frontal parietal cortex. Chronic lacunar infarct in the left pons. No acute hemorrhage, hydrocephalus, mass, or collection. Vascular: Major flow voids are preserved Skull and upper cervical spine: Normal marrow signal Sinuses/Orbits: 12 mm nodule in the left parotid with cystic features where partially covered, recommendations provided on preceding CTA. MRI CERVICAL SPINE FINDINGS  Alignment: Normal Vertebrae: No fracture, evidence of discitis, or bone lesion. Cord: Normal signal and morphology. Posterior Fossa, vertebral arteries, paraspinal tissues: Negative. Disc levels: Diffusely preserved disc height and hydration. No herniation or significant spurring. No neural impingement. IMPRESSION: Brain MRI: 1. 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 2. Chronic lacunar infarct in the left pons. Cervical MRI: Unremarkable.  Normal cord signal. Electronically Signed   By: Tiburcio Pea M.D.   On: 09/13/2023 06:43   MR Cervical Spine Wo Contrast  Result Date: 09/13/2023 CLINICAL DATA:  Acute cervical spine myelopathy. Stroke with arm weakness. EXAM: MRI HEAD WITHOUT CONTRAST MRI CERVICAL SPINE WITHOUT CONTRAST TECHNIQUE: Multiplanar, multiecho pulse sequences of the brain and surrounding structures, and cervical spine, to include the craniocervical junction and cervicothoracic junction, were obtained without intravenous contrast. COMPARISON:  Brain MRI 02/10/2023 FINDINGS: MRI HEAD FINDINGS Brain: Moderate area of restricted diffusion centered at the left frontal parietal cortex. Chronic lacunar infarct in the left pons. No acute hemorrhage, hydrocephalus, mass, or collection. Vascular: Major flow voids are preserved Skull and upper cervical spine: Normal marrow signal Sinuses/Orbits: 12 mm nodule in the left parotid with cystic features where partially covered, recommendations provided on preceding CTA. MRI CERVICAL SPINE FINDINGS Alignment: Normal Vertebrae: No fracture, evidence of discitis, or bone lesion. Cord: Normal signal and morphology. Posterior Fossa, vertebral arteries, paraspinal tissues: Negative. Disc levels: Diffusely preserved disc height and hydration. No herniation or significant spurring. No neural impingement. IMPRESSION: Brain MRI: 1. 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 2. Chronic lacunar infarct in the left pons. Cervical MRI:  Unremarkable.  Normal cord signal. Electronically Signed   By: Tiburcio Pea M.D.   On: 09/13/2023 06:43   CT ANGIO HEAD NECK W WO CM  Result Date: 09/13/2023 CLINICAL DATA:  Right hand numbness EXAM: CT ANGIOGRAPHY HEAD AND NECK WITH AND WITHOUT CONTRAST TECHNIQUE: Multidetector CT imaging of the head and neck was performed using  the standard protocol during bolus administration of intravenous contrast. Multiplanar CT image reconstructions and MIPs were obtained to evaluate the vascular anatomy. Carotid stenosis measurements (when applicable) are obtained utilizing NASCET criteria, using the distal internal carotid diameter as the denominator. RADIATION DOSE REDUCTION: This exam was performed according to the departmental dose-optimization program which includes automated exposure control, adjustment of the mA and/or kV according to patient size and/or use of iterative reconstruction technique. CONTRAST:  75mL OMNIPAQUE IOHEXOL 350 MG/ML SOLN COMPARISON:  None Available. FINDINGS: CT HEAD FINDINGS Brain: No mass, hemorrhage or extra-axial collection. Skull: The visualized skull base, calvarium and extracranial soft tissues are normal. Sinuses/Orbits: No fluid levels or advanced mucosal thickening of the visualized paranasal sinuses. No mastoid or middle ear effusion. Normal orbits. CTA NECK FINDINGS SKELETON: No acute abnormality or high grade bony spinal canal stenosis. OTHER NECK: 1.8 x 1.2 cm mass at the inferior aspect of the left parotid gland. UPPER CHEST: No pneumothorax or pleural effusion. No nodules or masses. AORTIC ARCH: There is no calcific atherosclerosis of the aortic arch. Conventional 3 vessel aortic branching pattern. Assessment of the vessels is severely limited due to poor opacification. The bolus may have triggered inaccurately due to artifacts caused by body habitus. Within that limitation, the carotid systems appear widely patent. There is no carotid bifurcation atherosclerotic  calcification. CTA HEAD FINDINGS As above, the intracranial vessels are also poorly opacified. There is no proximal large vessel occlusion. Otherwise, visualization is poor. Review of the MIP images confirms the above findings. IMPRESSION: 1. Severely limited assessment of the vessels due to poor opacification. The bolus may have triggered inaccurately due to artifacts caused by body habitus. Within that limitation, there is no proximal large vessel occlusion. 2. No acute intracranial abnormality. 3. 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 Consider histologic sampling, as benign and malignant parotid lesions may have the same appearance. Electronically Signed   By: Deatra Robinson M.D.   On: 09/13/2023 01:02    Pending Labs Unresulted Labs (From admission, onward)     Start     Ordered   09/14/23 0500  Basic metabolic panel  Tomorrow morning,   R        09/13/23 0814   09/14/23 0500  CBC  Tomorrow morning,   R        09/13/23 0814   09/14/23 0500  Protein C activity  (Hypercoagulable Panel, Comprehensive (PNL))  Tomorrow morning,   R        09/13/23 1140   09/14/23 0500  Protein C, total  (Hypercoagulable Panel, Comprehensive (PNL))  Tomorrow morning,   R        09/13/23 1140   09/14/23 0500  Protein S activity  (Hypercoagulable Panel, Comprehensive (PNL))  Tomorrow morning,   R        09/13/23 1140   09/14/23 0500  Protein S, total  (Hypercoagulable Panel, Comprehensive (PNL))  Tomorrow morning,   R        09/13/23 1140   09/14/23 0500  Lupus anticoagulant panel  (Hypercoagulable Panel, Comprehensive (PNL))  Tomorrow morning,   R        09/13/23 1140   09/14/23 0500  Beta-2-glycoprotein i abs, IgG/M/A  (Hypercoagulable Panel, Comprehensive (PNL))  Tomorrow morning,   R        09/13/23 1140   09/14/23 0500  Homocysteine, serum  (Hypercoagulable Panel, Comprehensive (PNL))  Tomorrow morning,  R        09/13/23 1140   09/14/23  0500  Factor 5 leiden  (Hypercoagulable Panel, Comprehensive (PNL))  Tomorrow morning,   R        09/13/23 1140   09/14/23 0500  Cardiolipin antibodies, IgG, IgM, IgA  (Hypercoagulable Panel, Comprehensive (PNL))  Tomorrow morning,   R        09/13/23 1140   09/13/23 1249  Magnesium  Add-on,   AD        09/13/23 1248   09/12/23 2121  Ethanol  Once,   URGENT        09/12/23 2121            Vitals/Pain Today's Vitals   09/13/23 1010 09/13/23 1130 09/13/23 1215 09/13/23 1300  BP:  (!) 140/81 (!) 160/112 (!) 152/113  Pulse: 73 77 73 81  Resp: 18 15 20  (!) 21  Temp: 97.6 F (36.4 C) 98.2 F (36.8 C)    TempSrc: Oral Oral    SpO2: 99% 100% 100% 100%  Weight:      PainSc:  0-No pain      Isolation Precautions No active isolations  Medications Medications  potassium chloride SA (KLOR-CON M) CR tablet 40 mEq (40 mEq Oral Not Given 09/13/23 0001)  enoxaparin (LOVENOX) injection 70 mg (70 mg Subcutaneous Given 09/13/23 1018)  senna-docusate (Senokot-S) tablet 1 tablet (has no administration in time range)  acetaminophen (TYLENOL) tablet 650 mg (has no administration in time range)    Or  acetaminophen (TYLENOL) suppository 650 mg (has no administration in time range)  rosuvastatin (CRESTOR) tablet 40 mg (40 mg Oral Given 09/13/23 1134)  aspirin chewable tablet 81 mg (81 mg Oral Given 09/13/23 1134)  clopidogrel (PLAVIX) tablet 75 mg (75 mg Oral Given 09/13/23 1134)  influenza vac split trivalent PF (FLULAVAL) injection 0.5 mL (has no administration in time range)  pneumococcal 20-valent conjugate vaccine (PREVNAR 20) injection 0.5 mL (has no administration in time range)  perflutren lipid microspheres (DEFINITY) IV suspension (4 mLs Intravenous Given 09/13/23 1402)  iohexol (OMNIPAQUE) 350 MG/ML injection 75 mL (75 mLs Intravenous Contrast Given 09/12/23 2205)  fentaNYL (SUBLIMAZE) injection 50 mcg (50 mcg Intravenous Given 09/13/23 0441)  LORazepam (ATIVAN) injection 1 mg (1 mg  Intravenous Given 09/13/23 0529)  metoprolol succinate (TOPROL-XL) 24 hr tablet 100 mg (100 mg Oral Given 09/13/23 0548)  losartan (COZAAR) tablet 25 mg (25 mg Oral Given 09/13/23 0548)  clopidogrel (PLAVIX) tablet 225 mg (225 mg Oral Given 09/13/23 1245)  iohexol (OMNIPAQUE) 350 MG/ML injection 75 mL (75 mLs Intravenous Contrast Given 09/13/23 1318)    Mobility walks     Focused Assessments Neuro Assessment Handoff:  Swallow screen pass? Yes    NIH Stroke Scale  Dizziness Present: No Headache Present: No Interval: Initial Level of Consciousness (1a.)   : Alert, keenly responsive LOC Questions (1b. )   : Answers both questions correctly LOC Commands (1c. )   : Performs both tasks correctly Best Gaze (2. )  : Normal Visual (3. )  : No visual loss Facial Palsy (4. )    : Normal symmetrical movements Motor Arm, Left (5a. )   : No drift Motor Arm, Right (5b. ) : No drift Motor Leg, Left (6a. )  : No drift Motor Leg, Right (6b. ) : No drift Limb Ataxia (7. ): Absent Sensory (8. )  : Normal, no sensory loss Best Language (9. )  : No aphasia Dysarthria (10. ): Normal  Extinction/Inattention (11.)   : No Abnormality Complete NIHSS TOTAL: 0     Neuro Assessment: Within Defined Limits Neuro Checks:   Initial (09/13/23 0351)  Has TPA been given? No If patient is a Neuro Trauma and patient is going to OR before floor call report to 4N Charge nurse: [phone] or [phone]   R Recommendations: See Admitting Provider Note  Report given to:   Additional Notes:

## 2023-09-13 NOTE — ED Provider Notes (Signed)
  Physical Exam  BP (!) 166/109 (BP Location: Right Wrist)   Pulse 90   Temp 98.7 F (37.1 C) (Oral)   Resp 18   Wt (!) 140.2 kg   LMP 09/11/2022 (Approximate)   SpO2 97%   BMI 53.04 kg/m   Physical Exam Vitals and nursing note reviewed.  Pulmonary:     Effort: Pulmonary effort is normal.  Skin:    General: Skin is warm and dry.  Neurological:     Mental Status: She is oriented to person, place, and time.     Procedures  Procedures  ED Course / MDM   Clinical Course as of 09/13/23 0138  Mon Sep 12, 2023  2259 Spoke with neurology, would like to be called after radiology has read imaging. [phone] [JB]    Clinical Course User Index [JB] Barrett, Horald Chestnut, PA-C   Medical Decision Making Amount and/or Complexity of Data Reviewed Labs: ordered. Radiology: ordered.  Risk Prescription drug management.   Care assumed from Dr. Jearld Fenton and PA Asher Muir at shift change.  Care signed out to me awaiting results of a CTA of the head and neck.  This has resulted and shows no evidence for large vessel occlusion or other abnormality.  There was a nodule noted in the parotid gland, but felt to be incidental.  This was discussed with Dr. Otelia Limes from neurology.  Patient will be transferred to [facility] for an MRI.       Geoffery Lyons, MD 09/13/23 [phone]

## 2023-09-13 NOTE — Consult Note (Addendum)
NEUROLOGY CONSULT NOTE   Date of service: September 13, 2023 Patient Name: Anita Montoya MRN:  [id number] DOB:  1981-02-10 Chief Complaint: "Right hand intermittent numbness and tingling, intermittent confusion" Requesting Provider: Glynn Octave, MD  History of Present Illness  Anita Montoya is a 42 y.o. female  has a past medical history of Duplicated left renal collecting system (2009), Herpes simplex, HLD (hyperlipidemia) (08/2013), Hypertension (1995), Menorrhagia (1989), and Pancreatitis., malignant hypertension, morbid obesity with BMI of 53.04 kg/m2, new onset headaches in March of 2024 with concern for idiopathic intracranial hypertension with slightly elevated opening pressure on LP at 26 cm H2O, severe OSA pending CPAP delivery to her home, on home oxygen for the last 2 years who presents with acute onset of right hand tingling and numbness and forgetfulness with onset at 15:00 on 09/12/23.  Patient states that since onset, her right hand sensory deficits have fluctuated as has her forgetfulness.  Family reported that at onset, the patient was walking to her car and forgot where she was going and family was having to redirect her frequently.   Regarding patient's headache history, patient states she began to have headaches in March 2024 described as either one-sided or holocephalic headaches with fullness and throbbing sensations, photophobia, and phonophobia, improved after laying down in a dark room. She reports associated transient bilateral foot numbness and weakness, transient left hand contractures and numbness/tingling, and complete bilateral vision loss always associated with headaches and never occur independently of headaches.  Patient reports her headaches occur 2-3 times per week with associated generalized lethargy and weakness.  She reports that her headaches are typically worse in the mornings but can occur at anytime of day.  Patient follows outpatient with Dr. Marjory Lies who had  an LP completed with an opening pressure of 26 cm of H2O on 02/18/23 concerning for possible idiopathic intracranial hypertension.  Patient reports improvement in headaches for 1 - 2 months following her LP with 30 cc of CSF removal before recurrence.  She also reports improvement in her headaches after initiation of Diamox and topiramate; she has continued on these medications  ROS  Comprehensive ROS performed and pertinent positives documented in HPI  Past History   Past Medical History:  Diagnosis Date   Duplicated left renal collecting system 2009   noted on 2009 CT abdomen and pelvis    Herpes simplex    HLD (hyperlipidemia) 08/2013   Hypertension 1995   Menorrhagia 1989   Pancreatitis    Past Surgical History:  Procedure Laterality Date   CHOLECYSTECTOMY     gallbadder removal     uterine poly removed   2009   heavy menstrual bleeding    Family History: Family History  Problem Relation Age of Onset   Clotting disorder Mother    Diabetes Father    Multiple sclerosis Sister    Heart disease Brother   Multiple sclerosis in her sister, blood clots requiring anticoagulation in mother  Social History  reports that she has been smoking cigarettes. She has never used smokeless tobacco. She reports current alcohol use. She reports that she does not use drugs.  No Known Allergies  Medications   Current Facility-Administered Medications:    potassium chloride SA (KLOR-CON M) CR tablet 40 mEq, 40 mEq, Oral, Once, Barrett, Horald Chestnut, PA-C  Current Outpatient Medications:    acetaZOLAMIDE ER (DIAMOX) 500 MG capsule, Take 1 capsule (500 mg total) by mouth 2 (two) times daily., Disp: 60 capsule, Rfl: 5   albuterol (VENTOLIN  HFA) 108 (90 Base) MCG/ACT inhaler, Inhale 2 puffs into the lungs every 4 (four) hours as needed., Disp: , Rfl:    amLODipine (NORVASC) 10 MG tablet, Take 10 mg by mouth daily., Disp: , Rfl:    atorvastatin (LIPITOR) 10 MG tablet, Take 10 mg by mouth daily., Disp:  , Rfl:    chlorthalidone (HYGROTON) 25 MG tablet, Take 1 tablet (25 mg total) by mouth daily., Disp: 30 tablet, Rfl: 5   diclofenac (VOLTAREN) 50 MG EC tablet, Take 50 mg by mouth 2 (two) times daily., Disp: , Rfl:    lisinopril (PRINIVIL,ZESTRIL) 40 MG tablet, Take 1 tablet (40 mg total) by mouth daily., Disp: 30 tablet, Rfl: 5   losartan (COZAAR) 100 MG tablet, Take 100 mg by mouth daily., Disp: , Rfl:    losartan (COZAAR) 25 MG tablet, Take 1 tablet (25 mg total) by mouth daily., Disp: 30 tablet, Rfl: 0   losartan (COZAAR) 50 MG tablet, Take 1 tablet by mouth daily., Disp: , Rfl:    medroxyPROGESTERone (PROVERA) 10 MG tablet, Take 1 tablet (10 mg total) by mouth daily. One tab daily for five days starting on the first day of the month. Repeat monthly to regulate cycle., Disp: 30 tablet, Rfl: 0   meloxicam (MOBIC) 7.5 MG tablet, Take 1 tablet (7.5 mg total) by mouth daily., Disp: 7 tablet, Rfl: 0   metoCLOPramide (REGLAN) 10 MG tablet, Take 1 tablet (10 mg total) by mouth every 6 (six) hours as needed for nausea (nausea/headache)., Disp: 10 tablet, Rfl: 0   metoprolol succinate (TOPROL-XL) 100 MG 24 hr tablet, Take 100 mg by mouth at bedtime., Disp: , Rfl:    metoprolol succinate (TOPROL-XL) 50 MG 24 hr tablet, Take 50 mg by mouth daily., Disp: , Rfl:    pantoprazole (PROTONIX) 40 MG tablet, Take 1 tablet (40 mg total) by mouth daily., Disp: 30 tablet, Rfl: 0   pantoprazole (PROTONIX) 40 MG tablet, Take 1 tablet (40 mg total) by mouth daily., Disp: 30 tablet, Rfl: 0   potassium chloride (KLOR-CON) 10 MEQ tablet, Take 10 mEq by mouth daily., Disp: , Rfl:    spironolactone (ALDACTONE) 50 MG tablet, Take 50 mg by mouth daily., Disp: , Rfl:    traMADol (ULTRAM) 50 MG tablet, Take 1 tablet (50 mg total) by mouth every 6 (six) hours as needed., Disp: 10 tablet, Rfl: 0   traMADol (ULTRAM) 50 MG tablet, Take 1 tablet (50 mg total) by mouth every 6 (six) hours as needed., Disp: 10 tablet, Rfl: 0    traZODone (DESYREL) 50 MG tablet, Take 50 mg by mouth at bedtime., Disp: , Rfl:   Vitals   Vitals:   09/12/23 2145 09/13/23 0030 09/13/23 0349 09/13/23 0552  BP: (!) 158/107 (!) 166/109 (!) 184/124 (!) 139/105  Pulse: 94 90 81 87  Resp: 20 18 17 16   Temp:  98.7 F (37.1 C) 97.9 F (36.6 C) 97.8 F (36.6 C)  TempSrc:  Oral  Oral  SpO2: 99% 97% 99% 99%  Weight:        Body mass index is 53.04 kg/m.  Physical Exam   Constitutional: Obese appearing African American female, well developed, in no acute distress laying in ER stretcher in hallway bed.  Psych: Affect appropriate to situation. Patient is calm and cooperative with examination.  Eyes: No scleral injection.  HENT: No OP obstruction.  Head: Normocephalic and atraumatic Cardiovascular: Normal rate and regular rhythm.  Respiratory: Effort normal, non-labored breathing on room air  GI: Soft.  Rounded.  No distension. There is no tenderness.  Skin: WDI.   Neurologic Examination   Mental Status: Patient is awake, alert, oriented to person, place, month, year, and situation. Patient is able to give a clear and coherent history.  Speech is fluent, no dysarthria noted.  No signs of aphasia or neglect Cranial Nerves: II: Visual Fields are full. Pupils are equal, round, and reactive to light.   III,IV, VI: EOMI without gaze preference or nystagmus V: Facial sensation is intact and symmetric to light touch VII: Facial movement is symmetric resting and with movement VIII: Hearing is intact to voice X: Palate elevates symmetrically, phonation intact XI: Shoulder shrug is symmetric XII: Tongue protrudes midline  Motor: 5/5 strength was present in bilateral arms and legs without unilateral weakness or asymmetry. Grip strength bilaterally with weakness, right slightly weaker than left. Right grip strength 4-/5, left 4/5. Bilateral hands with some slowed rapid alternating movements.  Ankle plantar and dorsiflexion 5/5  bilaterally. Sensory: Patient reports tingling to her left hand, though states this could be because she was leaning on her left arm throughout history taking and this could be an "asleep" sensation. She reports intact and equal sensation to bilateral lower extremities throughout and bilateral upper extremities proximal to the hands.  Patient reports resolution of presenting right hand sensory deficit on neurology evaluation (reports fluctuations in numbness/tingling since onset).  Cerebellar: FNF and HKS are intact bilaterally  On later attending examination, patient is very somnolent and therefore has difficulty fully participating in examination.  Breathing pattern is consistent with untreated sleep apnea.  Mild upper motor neuron pattern of weakness in the right upper extremity compared to the left.  Poor effort bilateral lower extremity testing.  Inconsistent history due to somnolence, no clear aphasia/neglect.  However with repeated stimulation she does awaken enough to participate well in examination, exam in proportion to fatigue from being sleep deprived  Labs   CBC:  Recent Labs  Lab 09/12/23 2105  WBC 13.9*  NEUTROABS 7.0  HGB 14.3  HCT 43.3  MCV 79.4*  PLT 473*   Basic Metabolic Panel:  Lab Results  Component Value Date   NA 138 09/12/2023   K 3.1 (L) 09/12/2023   CO2 25 09/12/2023   GLUCOSE 119 (H) 09/12/2023   BUN 9 09/12/2023   CREATININE 0.67 09/12/2023   CALCIUM 8.6 (L) 09/12/2023   GFRNONAA >60 09/12/2023   GFRAA >90 03/09/2015   Lipid Panel:  Lab Results  Component Value Date   LDLCALC 115 (H) 12/15/2013   HgbA1c:  Lab Results  Component Value Date   HGBA1C 5.90 03/19/2015   Urine Drug Screen:     Component Value Date/Time   LABOPIA NONE DETECTED 09/12/2023 2105   COCAINSCRNUR NONE DETECTED 09/12/2023 2105   LABBENZ NONE DETECTED 09/12/2023 2105   AMPHETMU NONE DETECTED 09/12/2023 2105   THCU NONE DETECTED 09/12/2023 2105   LABBARB NONE DETECTED  09/12/2023 2105    Alcohol Level No results found for: "ETH" INR  Lab Results  Component Value Date   INR 0.9 09/12/2023   APTT  Lab Results  Component Value Date   APTT 27 09/12/2023   AED levels: No results found for: "PHENYTOIN", "ZONISAMIDE", "LAMOTRIGINE", "LEVETIRACETA"  CT angio Head and Neck with contrast(Personally reviewed): 1. Severely limited assessment of the vessels due to poor opacification. The bolus may have triggered inaccurately due to artifacts caused by body habitus. Within that limitation, there is no proximal large vessel occlusion.  2. No acute intracranial abnormality. 3. 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 Consider histologic sampling, as benign and malignant parotid lesions may have the same appearance.  MRI Brain and cervical spine (Personally reviewed): Brain MRI: 1. 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 2. Chronic lacunar infarct in the left pons.   Cervical MRI: Unremarkable.  Normal cord signal.  ECHO 06/07/2023 TDS- Grossly normal LV wall motion and systolic function in visualized segments. The left ventricular size is normal. There is mild concentric left ventricular hypertrophy with normal wall motion, normal systolic function and ejection fraction  55% . There is mild, Grade I diastolic dysfunction, with indeterminate left atrial pressure. Left atrial pressure is indeterminate due to  E-e  > 8 and < 14 The right ventricle is not well visualized. The right ventricular systolic function is normal. The atria are normal in size. There is no obvious significant valvular stenosis or regurgitation. There was insufficient TR detected to calculate RV systolic pressure. IVC size was normal. The aortic sinus is normal size. There is no comparison study available.   Impression   Anita Montoya is a 42 y.o. female with a medical history significant for  morbid obesity with a BMI of 53.04 kg/m, malignant hypertension, ongoing tobacco use 0.5-1 ppd, herpes simplex, hyperlipidemia, possible idiopathic intracranial hypertension with slightly elevated opening pressure on LP 02/18/23 (26 cm H2O) with new onset headaches in March 2024 and associated left hand and bilateral feet transient numbness/tinging and weakness complaints as well as transient episodes of complete vision loss, and OSA requiring CPAP (does not yet have access to CPAP machine at home) who presented to the ED 10/28 for evaluation of acute onset of right hand tingling/numbness and forgetfulness that have been intermittent since onset at 15:00 on 10/28.   Though neuroimaging is severely limited as above, MRI brain imaging reveals a moderate area of acute left frontal parietal cortex infarct as well as a chronic infarct in the left pons.  Overall imaging is suggestive of an embolic event likely in the left MCA with scattered effects in the left MCA and left MCA/PCA watershed territories.  Needs vessel imaging to exclude critical MCA stenosis  Recommendations  - HgbA1c, fasting lipid panel, hypercoagulability panel (please send prothrombin gene mutation testing if homocystine is elevated, Antithrombin III testing not ordered given typically only seen in Caucasian population) - Repeat CT angio head and neck due to poor imaging results on initial imaging; patient unable to tolerate MRA due to claustrophobia  - Frequent neuro checks - Echocardiogram - Prophylactic therapy- Antiplatelet med: DAPT with ASA 81 mg PO daily and clopidogrel 75 mg PO daily (after 300 mg loading dose, additional 225 mg ordered to complete loading dose)  - Risk factor modification; counseled on smoking cessation, CPAP adherence, diet, exercise, weight loss - Telemetry monitoring - OT consult given fairly mild the right hand symptoms only - Outpatient follow-up for possible parotid neoplasm - Stroke team to follow along in  consultation ______________________________________________________________________  Dema Severin, AGACNP-BC [facility] Pager: [phone]  Attending Neurologist's note:  I personally saw this patient, gathering history, performing a full neurologic examination, reviewing relevant labs, personally reviewing relevant imaging including MRI brain, CTA head and neck, and formulated the assessment and plan, adding the note above for completeness and clarity to accurately reflect my thoughts   Brooke Dare MD-PhD [facility] [phone] Available 7  AM to 7 PM, outside these hours please contact Neurologist on call listed on AMION

## 2023-09-14 ENCOUNTER — Other Ambulatory Visit (HOSPITAL_COMMUNITY): Payer: Self-pay

## 2023-09-14 ENCOUNTER — Observation Stay (HOSPITAL_COMMUNITY): Payer: 59

## 2023-09-14 ENCOUNTER — Observation Stay (HOSPITAL_BASED_OUTPATIENT_CLINIC_OR_DEPARTMENT_OTHER): Payer: 59

## 2023-09-14 DIAGNOSIS — I63412 Cerebral infarction due to embolism of left middle cerebral artery: Secondary | ICD-10-CM

## 2023-09-14 DIAGNOSIS — I639 Cerebral infarction, unspecified: Secondary | ICD-10-CM | POA: Diagnosis not present

## 2023-09-14 DIAGNOSIS — I6340 Cerebral infarction due to embolism of unspecified cerebral artery: Secondary | ICD-10-CM

## 2023-09-14 LAB — GLUCOSE, CAPILLARY: Glucose-Capillary: 145 mg/dL — ABNORMAL HIGH (ref 70–99)

## 2023-09-14 LAB — CBC
HCT: 43.9 % (ref 36.0–46.0)
Hemoglobin: 14.4 g/dL (ref 12.0–15.0)
MCH: 25.9 pg — ABNORMAL LOW (ref 26.0–34.0)
MCHC: 32.8 g/dL (ref 30.0–36.0)
MCV: 79.1 fL — ABNORMAL LOW (ref 80.0–100.0)
Platelets: 422 10*3/uL — ABNORMAL HIGH (ref 150–400)
RBC: 5.55 MIL/uL — ABNORMAL HIGH (ref 3.87–5.11)
RDW: 15 % (ref 11.5–15.5)
WBC: 9.8 10*3/uL (ref 4.0–10.5)
nRBC: 0 % (ref 0.0–0.2)

## 2023-09-14 LAB — BASIC METABOLIC PANEL
Anion gap: 6 (ref 5–15)
BUN: 13 mg/dL (ref 6–20)
CO2: 23 mmol/L (ref 22–32)
Calcium: 8.6 mg/dL — ABNORMAL LOW (ref 8.9–10.3)
Chloride: 106 mmol/L (ref 98–111)
Creatinine, Ser: 0.77 mg/dL (ref 0.44–1.00)
GFR, Estimated: >60 mL/min (ref >60)
Glucose, Bld: 108 mg/dL — ABNORMAL HIGH (ref 70–99)
Potassium: 3.7 mmol/L (ref 3.5–5.1)
Sodium: 135 mmol/L (ref 135–145)

## 2023-09-14 MED ORDER — CLOPIDOGREL BISULFATE 75 MG PO TABS
75.0000 mg | ORAL_TABLET | Freq: Every day | ORAL | 0 refills | Status: DC
  Filled 2023-09-14: qty 21, 21d supply, fill #0

## 2023-09-14 MED ORDER — ASPIRIN 81 MG PO CHEW
81.0000 mg | CHEWABLE_TABLET | Freq: Every day | ORAL | 1 refills | Status: AC
  Filled 2023-09-14: qty 30, 30d supply, fill #0
  Filled 2024-03-19: qty 30, 30d supply, fill #1

## 2023-09-14 MED ORDER — POLYETHYLENE GLYCOL 3350 17 G PO PACK
17.0000 g | PACK | Freq: Every day | ORAL | Status: DC | PRN
  Administered 2023-09-14: 17 g via ORAL
  Filled 2023-09-14: qty 1

## 2023-09-14 MED ORDER — AMLODIPINE BESYLATE 10 MG PO TABS
10.0000 mg | ORAL_TABLET | Freq: Every day | ORAL | 0 refills | Status: AC
  Filled 2023-09-14: qty 30, 30d supply, fill #0

## 2023-09-14 MED ORDER — POLYETHYLENE GLYCOL 3350 17 G PO PACK: 17.0000 g | PACK | Freq: Once | ORAL | Status: DC

## 2023-09-14 MED ORDER — SENNOSIDES-DOCUSATE SODIUM 8.6-50 MG PO TABS
1.0000 | ORAL_TABLET | ORAL | Status: DC | PRN
Start: 2023-09-14 — End: 2023-09-14
  Administered 2023-09-14: 1 via ORAL
  Filled 2023-09-14: qty 1

## 2023-09-14 MED ORDER — SPIRONOLACTONE 50 MG PO TABS
50.0000 mg | ORAL_TABLET | Freq: Two times a day (BID) | ORAL | 0 refills | Status: AC
  Filled 2023-09-14: qty 60, 30d supply, fill #0

## 2023-09-14 MED ORDER — LOSARTAN POTASSIUM-HCTZ 100-25 MG PO TABS
1.0000 | ORAL_TABLET | Freq: Every day | ORAL | 3 refills | Status: AC
  Filled 2023-09-14: qty 30, 30d supply, fill #0
  Filled 2024-02-23: qty 30, 30d supply, fill #1
  Filled 2024-03-19: qty 30, 30d supply, fill #2

## 2023-09-14 MED ORDER — ROSUVASTATIN CALCIUM 40 MG PO TABS
40.0000 mg | ORAL_TABLET | Freq: Every day | ORAL | 3 refills | Status: AC
  Filled 2023-09-14: qty 30, 30d supply, fill #0
  Filled 2024-02-23: qty 30, 30d supply, fill #1
  Filled 2024-03-19: qty 30, 30d supply, fill #2

## 2023-09-14 MED ORDER — NICOTINE POLACRILEX 4 MG MT GUM
4.0000 mg | CHEWING_GUM | OROMUCOSAL | 0 refills | Status: AC | PRN
  Filled 2023-09-14: qty 50, fill #0

## 2023-09-14 NOTE — Plan of Care (Signed)
  Problem: Education: Goal: Knowledge of General Education information will improve Description: Including pain rating scale, medication(s)/side effects and non-pharmacologic comfort measures 09/14/2023 1533 by [place], RN Outcome: Progressing 09/14/2023 1532 by [place], RN Outcome: Progressing

## 2023-09-14 NOTE — TOC Transition Note (Signed)
Transition of Care [facility]) - CM/SW Discharge Note   Patient Details  Name: Anita Montoya MRN: [id number] Date of Birth: 10/09/1981  Transition of Care [facility]) CM/SW Contact:  Kermit Balo, RN Phone Number: 09/14/2023, 10:29 AM   Clinical Narrative:     Patient is from home with her mother.  She has: cane/ walker at home she is not using. Oxygen that she uses with activity through [place]. She has a CPAP ordered that has not come yet.  She says her mom can assist with transportation if needed.  She manages her own medications and denies any issues.   No f/u per PT.  Mom or brother will transport home.  Final next level of care: Home/Self Care Barriers to Discharge: No Barriers Identified   Patient Goals and CMS Choice      Discharge Placement                         Discharge Plan and Services Additional resources added to the After Visit Summary for                                       Social Determinants of Health (SDOH) Interventions SDOH Screenings   Food Insecurity: No Food Insecurity (09/13/2023)  Recent Concern: Food Insecurity - Medium Risk (08/23/2023)   Received from [facility]  Housing: Low Risk  (09/13/2023)  Transportation Needs: No Transportation Needs (09/13/2023)  Utilities: Not At Risk (09/13/2023)  Social Connections: Unknown (03/15/2022)   [facility] from [facility]  Tobacco Use: High Risk (09/12/2023)     Readmission Risk Interventions     No data to display

## 2023-09-14 NOTE — Hospital Course (Addendum)
Cerebral Infarction (improving)  Pt with acute neurological complaints of right handed numbness and intermittent confusion.  Found to have stroke on imaging. Neurology consulted. Will start ASA 81 mg daily and plavix 75 mg daily for 21 days to continue with monotherapy aspirin afterwards.  Crestor 40 mg started, previously on Lipitor 10 mg daily.  Lipid panel was collected with an elevated LDL of 101 A1c was in the prediabetic range at 5.9. ECHO demonstrated an EF of 60 to 65% and mild concentric LVH.  Transcranial Doppler ultrasound performed and read was pending. Lower extremity DVT were negative. Neurology recommended a TEE given the patient's cortical stroke and continued cardiac monitoring. The patient was instructed to follow up with [facility] cardiologist to assess potential causes for stroke.    Patient was instructed to follow up with [facility] for assessment of stroke and pseudotumor cerebri symptoms.   Hypokalemia (resolved)  Patient was intermittently hyperkalemic on admission at 3.4.  Checking magnesium and repleting both as needed for goal K>4 and Mag>2 given structural heart disease.    Parotid Nodule:  An incidental finding on CT scan.  1.2 cm left parotid nodule with some cystic features. 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 Will need OP follow up. Will likely need biopsy given her smoking hx and cystic appearance of the lesion.    Chronic Problems: Chronic Hypoxic Respiratory Failure At home on 2 L while ambulating. Currently at room air. No acute exacerbation. Follows with pulmonology OP. Uses albuterol as needed.  PT and OT evaluated the patient and indicated no need for services at discharge.  PFTs in 2023 did not show any obstructive disease. Continue OP pulm follow p with weaning off oxygen as able.    Idiopathic Intracranial HTN: Seen by Dr. Marjory Lies. LP showed elevated pressures. Was started on azetazolamide for this but pt has not had any recent  refills of this. She did not recall this medication when it was discussed with her.  Patient restarted medication on 10-30.  He was instructed to follow up with her neurologist.    OSA: AHI score of 55.3.  Patient was ordered a  CPAP and tolerated it well.   HTN: Permissive HTN for now. Slowly start based on echo findings but likely with ARB. Suspect resistant to control 2/2 to severe OSA.  Patient's home regimen was held to allow for permissive hypertension.  At time of discharge the patient was ordered Hyzaar 100-25 to help with pill burden. She continued with her home regimen of amlodipine 10 mg daily and spironolactone 50 mg twice daily   MDD: Patient continued her home trazodone 50 mg nightly throughout admission.

## 2023-09-14 NOTE — Evaluation (Signed)
Occupational Therapy Evaluation Patient Details Name: Anita Montoya MRN: [id number] DOB: [birth year] Today's Date: 09/14/2023   History of Present Illness Pt is a 42 y/o female admitted to the ED 10/28 with Right-sided hand and arm numbness. MRI showed an acute stroke in the left frontal/parietal cortex with a chronic lacunar infarct in the left pons.  PMHx:  HTN, cholecytectomy   Clinical Impression   Anita Montoya was evaluated s/p the above admission list. She is indep and works at baseline. Upon evaluation the pt reported her symptoms have resolved and she feels back to baseline. Overall she demonstrated indep ability to complete ADLs and mobility with out DME. Of note pt did report that she has intermittent visual changes where she sees flashing lights or her vision goes dark; educated pt to keep track of symptoms and follow up with neurologist. Reviewed BE FAST and risk factors including smoking cessation. Pt does not require further acute or follow up OT, recommend d/c to home with support of family as needed.        If plan is discharge home, recommend the following: Other (comment) (back to indep baseline)       Equipment Recommendations  None recommended by OT       Precautions / Restrictions Precautions Precautions: None Restrictions Weight Bearing Restrictions: No      Mobility Bed Mobility Overal bed mobility: Independent                  Transfers Overall transfer level: Independent                        Balance Overall balance assessment: Independent               ADL either performed or assessed with clinical judgement   ADL Overall ADL's : Independent         General ADL Comments: baseline, including FM and dexterity     Vision Baseline Vision/History: 0 No visual deficits Vision Assessment?: No apparent visual deficits     Perception Perception: Within Functional Limits       Praxis Praxis: WFL       Pertinent Vitals/Pain  Pain Assessment Pain Assessment: No/denies pain     Extremity/Trunk Assessment Upper Extremity Assessment Upper Extremity Assessment: Overall WFL for tasks assessed (R symptoms resolved)   Lower Extremity Assessment Lower Extremity Assessment: Overall WFL for tasks assessed   Cervical / Trunk Assessment Cervical / Trunk Assessment: Normal   Communication Communication Communication: No apparent difficulties   Cognition Arousal: Alert Behavior During Therapy: WFL for tasks assessed/performed Overall Cognitive Status: Within Functional Limits for tasks assessed       General Comments  reviewed BE FAST and risk factors including importance of smoking cessation            Home Living Family/patient expects to be discharged to:: Private residence Living Arrangements: Parent;Other relatives Available Help at Discharge: Family;Available 24 hours/day Type of Home: House Home Access: Stairs to enter [employer] of Steps: 2/3 Entrance Stairs-Rails: Right;Left Home Layout: Able to live on main level with bedroom/bathroom;Other (Comment)     Bathroom Shower/Tub: Tub/shower unit;Curtain   Bathroom Toilet: Standard Bathroom Accessibility: Yes   Home Equipment: Teacher, English as a foreign language (2 wheels);Cane - single point;Grab bars - tub/shower          Prior Functioning/Environment Prior Level of Function : Independent/Modified Independent;Driving             Mobility Comments: no AD ADLs Comments:  indep, works        OT Problem List: Decreased knowledge of precautions         OT Goals(Current goals can be found in the care plan section) Acute Rehab OT Goals Patient Stated Goal: home OT Goal Formulation: With patient Time For Goal Achievement: 09/14/23 Potential to Achieve Goals: Good   AM-PAC OT "6 Clicks" Daily Activity     Outcome Measure Help from another person eating meals?: None Help from another person taking care of personal grooming?:  None Help from another person toileting, which includes using toliet, bedpan, or urinal?: None Help from another person bathing (including washing, rinsing, drying)?: None Help from another person to put on and taking off regular upper body clothing?: None Help from another person to put on and taking off regular lower body clothing?: None 6 Click Score: 24   End of Session Nurse Communication: Mobility status  Activity Tolerance: Patient tolerated treatment well Patient left: in bed  OT Visit Diagnosis: Hemiplegia and hemiparesis (resolved) Hemiplegia - caused by: Cerebral infarction                Time: 1610-9604 OT Time Calculation (min): 13 min Charges:  OT General Charges $OT Visit: 1 Visit OT Evaluation $OT Eval Low Complexity: 1 Low  Derenda Mis, OTR/L Acute Rehabilitation Services Office [phone] Secure Chat Communication Preferred   Donia Pounds 09/14/2023, 11:57 AM

## 2023-09-14 NOTE — Discharge Summary (Addendum)
Name: Anita Montoya MRN: [id number] DOB: Nov 21, 1980 42 y.o. PCP: Gillian Scarce  Date of Admission: 09/12/2023  9:08 PM Date of Discharge:  09/14/2023 Attending Physician: Dr. Criselda Peaches  DISCHARGE DIAGNOSIS:  Primary Problem: Cerebral infarction [facility])   Hospital Problems: Principal Problem:   Cerebral infarction [facility])    DISCHARGE MEDICATIONS:   Allergies as of 09/14/2023   No Known Allergies      Medication List     STOP taking these medications    atorvastatin 10 MG tablet Commonly known as: LIPITOR   chlorthalidone 25 MG tablet Commonly known as: HYGROTON   diclofenac 50 MG EC tablet Commonly known as: VOLTAREN   losartan 100 MG tablet Commonly known as: COZAAR   metoprolol succinate 100 MG 24 hr tablet Commonly known as: TOPROL-XL       TAKE these medications    acetaZOLAMIDE ER 500 MG capsule Commonly known as: DIAMOX Take 1 capsule (500 mg total) by mouth 2 (two) times daily.   albuterol 108 (90 Base) MCG/ACT inhaler Commonly known as: VENTOLIN HFA Inhale 2 puffs into the lungs every 4 (four) hours as needed.   amLODipine 10 MG tablet Commonly known as: NORVASC Take 1 tablet (10 mg total) by mouth daily.   Aspirin Low Dose 81 MG chewable tablet Generic drug: aspirin Chew 1 tablet (81 mg total) by mouth daily. Start taking on: September 15, 2023   clopidogrel 75 MG tablet Commonly known as: PLAVIX Take 1 tablet (75 mg total) by mouth daily. Start taking on: September 15, 2023   losartan-hydrochlorothiazide 100-25 MG tablet Commonly known as: HYZAAR Take 1 tablet by mouth daily.   nicotine polacrilex 4 MG gum Commonly known as: Nicorette Take 1 each (4 mg total) by mouth as needed for smoking cessation.   rosuvastatin 40 MG tablet Commonly known as: CRESTOR Take 1 tablet (40 mg total) by mouth daily. Start taking on: September 15, 2023   spironolactone 50 MG tablet Commonly known as: ALDACTONE Take 1 tablet (50 mg total)  by mouth 2 (two) times daily.   traZODone 50 MG tablet Commonly known as: DESYREL Take 50 mg by mouth at bedtime.        DISPOSITION AND FOLLOW-UP:  Ms.Anita Montoya was discharged from [facility] in stable condition. At the hospital follow up visit please address:  Follow-up Recommendations: Consults: Neurology, schedule f/u appoint with [facility]  Labs: None  Studies: Histological sample needed for A 1.8 x 1.2 cm mass at the inferior aspect of the left parotid gland to rule out benign vs malignant lesion - Follow-up Transcranial ultrasound at outpatient appointments - Follow-up antiphospholipid antibody syndrome studies Medications:  - Continue ASA 81 mg daily  and Plavix 75 mg daily for 21 days  - Continue Crestor 40 mg daily  - Start Hyzaar (Losartan and Chlorthalidone) 100-25 mg daily   Follow-up Appointments: [facility] 08/21/2024 Dr. Camelia Phenes at 9:20 AM  [facility] 01/24/2024 Dr. Karren Burly at 10:45 AM   HOSPITAL COURSE:  Patient Summary: Cerebral Infarction (improving)  Pt with acute neurological complaints of right handed numbness and intermittent confusion.  Found to have stroke on imaging. Neurology consulted. Will start ASA 81 mg daily and plavix 75 mg daily for 21 days to continue with monotherapy aspirin afterwards.  Crestor 40 mg started, previously on Lipitor 10 mg daily.  Lipid panel was collected with an elevated LDL of 101 A1c was in the prediabetic range at 5.9. ECHO demonstrated  an EF of 60 to 65% and mild concentric LVH.  Transcranial Doppler ultrasound performed and read was pending. Lower extremity DVT were negative. Neurology recommended a TEE given the patient's cortical stroke and continued cardiac monitoring.  This can be done outpatient by Cardiology per Neurology. The patient was instructed to follow up with [facility] cardiologist to assess potential causes for stroke.    Patient was instructed to follow up  with [facility] for assessment of stroke and pseudotumor cerebri symptoms.   Hypokalemia (resolved)  Patient was intermittently hyperkalemic on admission at 3.4.  Checking magnesium and repleting both as needed for goal K>4 and Mag>2 given structural heart disease.    Parotid Nodule:  An incidental finding on CT scan.  1.2 cm left parotid nodule with some cystic features. 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 Will need OP follow up. Will likely need biopsy given her smoking hx and cystic appearance of the lesion.    Chronic Problems: Chronic Hypoxic Respiratory Failure At home on 2 L while ambulating. Currently at room air. No acute exacerbation. Follows with pulmonology OP. Uses albuterol as needed.  PT and OT evaluated the patient and indicated no need for services at discharge.  PFTs in 2023 did not show any obstructive disease. Continue OP pulm follow p with weaning off oxygen as able.    Idiopathic Intracranial HTN: Seen by Dr. Marjory Lies. LP showed elevated pressures. Was started on azetazolamide for this but pt has not had any recent refills of this. She did not recall this medication when it was discussed with her.  Patient restarted medication on 10-30.  He was instructed to follow up with her neurologist.    OSA: AHI score of 55.3.  Patient was ordered a  CPAP and tolerated it well.   HTN: Permissive HTN for now. Slowly start based on echo findings but likely with ARB. Suspect resistant to control 2/2 to severe OSA.  Patient's home regimen was held to allow for permissive hypertension.  At time of discharge the patient was ordered Hyzaar 100-25 to help with pill burden. She continued with her home regimen of amlodipine 10 mg daily and spironolactone 50 mg twice daily   MDD: Patient continued her home trazodone 50 mg nightly throughout admission.     DISCHARGE INSTRUCTIONS:   Discharge Instructions     Ambulatory referral to Neurology   Complete by: As  directed    An appointment is requested in approximately: 8 weeks       SUBJECTIVE:   Patient feels much better this morning.  Denies any weakness, numbness, changes in vision or current headache.  Patient worked well with PT yesterday.  Anticipates possible home discharge this afternoon.  Can arrange a ride with family members to come and pick her up.  Was instructed that she did have a stroke and needs to follow up with neurology.  Patient amenable and voices understanding.  Will continue to take Plavix and aspirin for the next 3 weeks and follow-up with them about additional medication adjustments.  Discharge Vitals:   BP (!) 159/109 (BP Location: Left Arm)   Pulse 87   Temp 98.4 F (36.9 C) (Oral)   Resp 18   Ht 5\' 4"  (1.626 m)   Wt (!) 139.1 kg   LMP 09/11/2022 (Approximate)   SpO2 90%   BMI 52.64 kg/m   OBJECTIVE:  Physical Exam Constitutional:      General: She is not in acute distress.    Appearance: Normal  appearance. She is not ill-appearing.  Eyes:     Conjunctiva/sclera: Conjunctivae normal.  Cardiovascular:     Rate and Rhythm: Normal rate and regular rhythm.     Heart sounds: No murmur heard. Pulmonary:     Effort: Pulmonary effort is normal.     Breath sounds: Normal breath sounds. No wheezing.  Abdominal:     General: Bowel sounds are normal.     Palpations: Abdomen is soft.  Musculoskeletal:        General: Normal range of motion.  Skin:    General: Skin is warm.  Neurological:     General: No focal deficit present.     Mental Status: She is alert. Mental status is at baseline.     Cranial Nerves: No cranial nerve deficit.     Sensory: No sensory deficit.     Motor: Weakness present.     Comments: Gross motor intact  5 out 5 strength on upper extremity and right lower extremity  4 out of 5 strength on left lower extremity  Psychiatric:        Mood and Affect: Mood normal.        Behavior: Behavior normal.      Pertinent Labs, Studies, and  Procedures:     Latest Ref Rng & Units 09/14/2023    6:26 AM 09/12/2023    9:05 PM 02/10/2023    4:20 PM  CBC  WBC 4.0 - 10.5 K/uL 9.8  13.9  9.8   Hemoglobin 12.0 - 15.0 g/dL 95.6  38.7  56.4   Hematocrit 36.0 - 46.0 % 43.9  43.3  47.4   Platelets 150 - 400 K/uL 422  473  455        Latest Ref Rng & Units 09/14/2023    6:26 AM 09/12/2023    9:05 PM 02/10/2023    4:20 PM  CMP  Glucose 70 - 99 mg/dL 332  951  884   BUN 6 - 20 mg/dL 13  9  10    Creatinine 0.44 - 1.00 mg/dL 1.66  0.63  0.16   Sodium 135 - 145 mmol/L 135  138  138   Potassium 3.5 - 5.1 mmol/L 3.7  3.1  4.0   Chloride 98 - 111 mmol/L 106  104  104   CO2 22 - 32 mmol/L 23  25  24    Calcium 8.9 - 10.3 mg/dL 8.6  8.6  8.9   Total Protein 6.5 - 8.1 g/dL  7.7    Total Bilirubin 0.3 - 1.2 mg/dL  0.3    Alkaline Phos 38 - 126 U/L  77    AST 15 - 41 U/L  14    ALT 0 - 44 U/L  16     VAS [place] LOWER EXTREMITY VENOUS (DVT)  Lower Venous DVT Study  Patient Name:  Anita Montoya  Date of Exam:   09/14/2023 Medical Rec #: [id number]     Accession #:    [id number] Date of Birth: [birth year]    Patient Gender: F Patient Age:   40 years Exam Location:  [facility] Procedure:      VAS [place] LOWER EXTREMITY VENOUS (DVT) Referring Phys: Scheryl Marten XU  --------------------------------------------------------------------------------   Indications: Stroke.   Comparison Study: No previous exams  Performing Technologist: Jody Hill RVT, RDMS    Examination Guidelines: A complete evaluation includes B-mode imaging, spectral Doppler, color Doppler, and power Doppler as needed of all accessible portions of each vessel. Bilateral testing is  considered an integral part of a complete examination. Limited examinations for reoccurring indications may be performed as noted. The reflux portion of the exam is performed with the patient in reverse Trendelenburg.      +---------+---------------+---------+-----------+----------+--------------+ RIGHT    CompressibilityPhasicitySpontaneityPropertiesThrombus Aging +---------+---------------+---------+-----------+----------+--------------+ CFV      Full           Yes      Yes                                 +---------+---------------+---------+-----------+----------+--------------+ SFJ      Full                                                        +---------+---------------+---------+-----------+----------+--------------+ FV Prox  Full           Yes      Yes                                 +---------+---------------+---------+-----------+----------+--------------+ FV Mid   Full           Yes      Yes                                 +---------+---------------+---------+-----------+----------+--------------+ FV DistalFull           Yes      Yes                                 +---------+---------------+---------+-----------+----------+--------------+ PFV      Full                                                        +---------+---------------+---------+-----------+----------+--------------+ POP      Full           Yes      Yes                                 +---------+---------------+---------+-----------+----------+--------------+ PTV      Full                                                        +---------+---------------+---------+-----------+----------+--------------+ PERO     Full                                                        +---------+---------------+---------+-----------+----------+--------------+        +---------+---------------+---------+-----------+----------+--------------+ LEFT     CompressibilityPhasicitySpontaneityPropertiesThrombus Aging +---------+---------------+---------+-----------+----------+--------------+ CFV      Full  Yes      Yes                                  +---------+---------------+---------+-----------+----------+--------------+ SFJ      Full                                                        +---------+---------------+---------+-----------+----------+--------------+ FV Prox  Full           Yes      Yes                                 +---------+---------------+---------+-----------+----------+--------------+ FV Mid   Full           Yes      Yes                                 +---------+---------------+---------+-----------+----------+--------------+ FV DistalFull           Yes      Yes                                 +---------+---------------+---------+-----------+----------+--------------+ PFV      Full                                                        +---------+---------------+---------+-----------+----------+--------------+ POP      Full           Yes      Yes                                 +---------+---------------+---------+-----------+----------+--------------+ PTV      Full                                                        +---------+---------------+---------+-----------+----------+--------------+ PERO     Full                                                        +---------+---------------+---------+-----------+----------+--------------+             Summary: BILATERAL: - No evidence of deep vein thrombosis seen in the lower extremities, bilaterally. -No evidence of popliteal cyst, bilaterally.       *See table(s) above for measurements and observations.  Electronically signed by Sherald Hess MD on 09/14/2023 at 3:58:20 PM.      Final    Signed: Peterson Ao, MD Psychiatry Resident, PGY-1 [facility]  Pager: [phone] 4:09 PM, 09/14/2023

## 2023-09-14 NOTE — Progress Notes (Addendum)
STROKE TEAM PROGRESS NOTE   BRIEF HPI Ms.Arneisha Shillington is a 42 y.o. female  has a past medical history of Duplicated left renal collecting system (2009), Herpes simplex, HLD, HTN, Pancreatitis, morbid obesity with BMI of 53.04 kg/m2, new onset headaches in March of 2024 with concern for idiopathic intracranial hypertension with slightly elevated opening pressure on LP at 26 cm H2O, severe OSA pending CPAP delivery to her home, on home oxygen for the last 2 years who presents with acute onset of right hand tingling and numbness and forgetfulness with onset at 15:00 on 09/12/23.   Patient is on Diomax and topomax for headaches, follows with Dr. Marjory Lies MRI showed acute left parietal cortex infarct.   INTERIM HISTORY/SUBJECTIVE  On exam, patient states she feels back to her baseline, denies any numbness. TCD Bubble study unable to obtain window.  DVT negative.   OBJECTIVE  CBC    Component Value Date/Time   WBC 9.8 09/14/2023 0626   RBC 5.55 (H) 09/14/2023 0626   HGB 14.4 09/14/2023 0626   HCT 43.9 09/14/2023 0626   PLT 422 (H) 09/14/2023 0626   MCV 79.1 (L) 09/14/2023 0626   MCH 25.9 (L) 09/14/2023 0626   MCHC 32.8 09/14/2023 0626   RDW 15.0 09/14/2023 0626   LYMPHSABS 5.2 (H) 09/12/2023 2105   MONOABS 0.9 09/12/2023 2105   EOSABS 0.7 (H) 09/12/2023 2105   BASOSABS 0.1 09/12/2023 2105    BMET    Component Value Date/Time   NA 135 09/14/2023 0626   K 3.7 09/14/2023 0626   CL 106 09/14/2023 0626   CO2 23 09/14/2023 0626   GLUCOSE 108 (H) 09/14/2023 0626   BUN 13 09/14/2023 0626   CREATININE 0.77 09/14/2023 0626   CREATININE 0.69 12/15/2013 1100   CALCIUM 8.6 (L) 09/14/2023 0626   GFRNONAA >60 09/14/2023 0626    IMAGING past 24 hours No results found.  Vitals:   09/14/23 0515 09/14/23 0720 09/14/23 1143 09/14/23 1320  BP:  (!) 147/90 (!) 169/112 130/80  Pulse:  73 87 75  Resp:  17 18   Temp:  97.7 F (36.5 C) 98 F (36.7 C)   TempSrc:  Oral Oral   SpO2:  98%  96%   Weight: (!) 139.1 kg     Height:        PHYSICAL EXAM General:  Alert, well-nourished, well-developed patient in no acute distress Psych:  Mood and affect appropriate for situation CV: Regular rate and rhythm on monitor Respiratory:  Regular, unlabored respirations on room air GI: Abdomen soft and nontender  NEURO:  Mental Status: AA&Ox3, patient is able to give somewhat clear and coherent history Speech/Language: speech is without dysarthria or aphasia.  Naming, repetition, fluency, and comprehension intact.  Cranial Nerves:  II: PERRL. Visual fields full.  III, IV, VI: EOMI. Eyelids elevate symmetrically.  V: Sensation is intact to light touch and symmetrical to face.  VII: Face is symmetrical resting and smiling VIII: hearing intact to voice. IX, X: Palate elevates symmetrically. Phonation is normal.  BJ:YNWGNFAO shrug symmetric XII: tongue is midline without fasciculations. Motor: 5/5 strength to all muscle groups tested.  Tone: is normal and bulk is normal Sensation- Intact to light touch bilaterally.    Coordination: FTN intact bilaterally, HKS: no ataxia in BLE.No drift.  Gait- deferred  ASSESSMENT/PLAN  Acute ischemic infarct: Left frontal parietal cortex infarct, embolic pattern, etiology not quite clear, concerning for cardioembolic source but patient does have multiple stroke risk factors Code Stroke CT head:  No acute abnormality.  CTA head & neck: No LVO or significant stenosis MRI  Acute left frontal parietal cortex infarct. Chronic lacunar left pons infarct 2D Echo: LVEF 60-65%, Negative bubble Study TCD with Bubble Study: No window obtained.  [place] LE venous Doppler: Negative DVT Recommend patient to follow-up with her cardiologist Dr. Karren Burly to consider TEE and prolonged cardiac monitoring to rule out A-fib LDL 102 HgbA1c 5.9 UDS negative VTE prophylaxis - lovenox No antithrombotic prior to admission, now on aspirin 81 mg daily and clopidogrel 75 mg  daily for 3 weeks and then aspirin alone. Therapy recommendations:  No follow up needed  Disposition:  home  Pseudotumor cerebri Follow-up with Dr. Marjory Lies at [facility] On Diamox and Topamax Continue home medication Follow-up with Dr. Marjory Lies in 4 weeks  Hypertension Home meds:  losartan 100mg  Stable Long-term BP goal normotensive  Hyperlipidemia Home meds:  Lipitor 10mg  LDL 102, goal < 70 Add Crestor 40mg   Continue statin at discharge  Tobacco Abuse Current smoker Nicotine replacement therapy and cessation education provided  Other Stroke Risk Factors Obesity, Body mass index is 52.64 kg/m., BMI >/= 30 associated with increased stroke risk, recommend weight loss, diet and exercise as appropriate  Severe obstructive sleep apnea, waiting for CPAP to be delivered at home Migraines  Other Active Problems History of HSV infection 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 Note/plan to be edited by MD as needed.    Lynnae January, DNP, AGACNP-BC [facility] Please use AMION for contact information & EPIC for messaging.  ATTENDING NOTE: I reviewed above note and agree with the assessment and plan. Pt was seen and examined.   No family at bedside.  Patient reclining in bed, stated that her symptoms all resolved, she is back to baseline.  Neuro exam intact at this time.  MRI showed left parietal frontal infarct, cortical-based concerning for cardioembolic source.  However patient does have multiple stroke risk factors.  Recommend aggressive risk factor modification.  Also recommend follow-up with her cardiologist Dr. Karren Burly to consider outpatient TEE and prolonged Cardiac monitoring.  Continue Diamox and Topamax and follow-up with Dr. Marjory Lies at [facility].  Continue DAPT for 3 weeks and then aspirin alone.  Continue statin.  PT and OT no recommendation.  For detailed assessment and plan,  please refer to above/below as I have made changes wherever appropriate.   Neurology will sign off. Please call with questions. Pt will follow up with Dr. Marjory Lies at [facility] in about 4 weeks. Thanks for the consult.   Marvel Plan, MD PhD Stroke Neurology 09/14/2023 5:33 PM      To contact Stroke Continuity provider, please refer to [email or web address]. After hours, contact General Neurology

## 2023-09-14 NOTE — Progress Notes (Signed)
BLE venous duplex has been completed.     Results can be found under chart review under CV PROC. 09/14/2023 3:39 PM Shaman Muscarella RVT, RDMS

## 2023-09-14 NOTE — Progress Notes (Signed)
18:10H Patient discharged via wheelchair accompanied by nurse tech to main entrance. AVS was reviewed and explained by Leta Jungling

## 2023-09-14 NOTE — Plan of Care (Signed)

## 2023-09-14 NOTE — Discharge Instructions (Addendum)
You were hospitalized for her new left-sided stroke. Thank you for allowing [place] to be part of your care.  Please note these changes made to your medications:   *Please START taking:  - Plavix 75 mg daily and Asprin 81 mg daily for 21 days THEN transition to only taking the Asprin daily afterwards - Hyzaar combination pill (100 Losartan -25 chlorthalidone) decreased pill  burden  *Please STOP taking:  - Losartan 100 mg daily -Chlorthalidone 25 mg daily  Please make sure to follow up with:   - Please call [facility] at [phone] to follow-up with Dr. Marjory Lies in 8 weeks for stroke and pseudotumor cerebri medication management    - Please call Dr. Karren Burly, [facility] to arrange outpatient TEE and prolonged cardiac monitoring.   - Please arrange hospital follow-up with [facility] in 1-2 weeks to discuss parotid gland mass, you will need histological testing (possible biopsy) to assess   - Follow-up about transcranial ultrasound at follow-up appointments

## 2023-09-15 LAB — PROTEIN C, TOTAL: Protein C, Total: 72 % (ref 60–150)

## 2023-09-16 LAB — LUPUS ANTICOAGULANT PANEL
DRVVT: 34.8 s (ref 0.0–47.0)
PTT Lupus Anticoagulant: 34.6 s (ref 0.0–43.5)

## 2023-09-16 LAB — CARDIOLIPIN ANTIBODIES, IGG, IGM, IGA
Anticardiolipin IgA: <9 U/mL (ref 0–11)
Anticardiolipin IgG: <9 [GPL'U]/mL (ref 0–14)
Anticardiolipin IgM: <9 [MPL'U]/mL (ref 0–12)

## 2023-09-16 LAB — PROTEIN S ACTIVITY: Protein S Activity: 85 % (ref 63–140)

## 2023-09-16 LAB — PROTEIN C ACTIVITY: Protein C Activity: 87 % (ref 73–180)

## 2023-09-16 LAB — BETA-2-GLYCOPROTEIN I ABS, IGG/M/A
Beta-2 Glyco I IgG: <9 GPI IgG units (ref 0–20)
Beta-2-Glycoprotein I IgA: <9 GPI IgA units (ref 0–25)
Beta-2-Glycoprotein I IgM: <9 GPI IgM units (ref 0–32)

## 2023-09-16 LAB — PROTEIN S, TOTAL: Protein S Ag, Total: 89 % (ref 60–150)

## 2023-09-16 LAB — HOMOCYSTEINE: Homocysteine: 14.2 umol/L (ref 0.0–14.5)

## 2023-09-20 LAB — FACTOR 5 LEIDEN

## 2023-11-23 ENCOUNTER — Encounter: Payer: Self-pay | Admitting: Diagnostic Neuroimaging

## 2023-11-23 ENCOUNTER — Ambulatory Visit (INDEPENDENT_AMBULATORY_CARE_PROVIDER_SITE_OTHER): Payer: 59 | Admitting: Diagnostic Neuroimaging

## 2023-11-23 VITALS — BP 148/92 | HR 103 | Ht 64.0 in | Wt 312.0 lb

## 2023-11-23 DIAGNOSIS — I10 Essential (primary) hypertension: Secondary | ICD-10-CM | POA: Diagnosis not present

## 2023-11-23 DIAGNOSIS — G4733 Obstructive sleep apnea (adult) (pediatric): Secondary | ICD-10-CM

## 2023-11-23 DIAGNOSIS — G932 Benign intracranial hypertension: Secondary | ICD-10-CM | POA: Diagnosis not present

## 2023-11-23 DIAGNOSIS — E785 Hyperlipidemia, unspecified: Secondary | ICD-10-CM | POA: Diagnosis not present

## 2023-11-23 DIAGNOSIS — I63412 Cerebral infarction due to embolism of left middle cerebral artery: Secondary | ICD-10-CM | POA: Diagnosis not present

## 2023-11-23 MED ORDER — ACETAZOLAMIDE ER 500 MG PO CP12: 1000.0000 mg | ORAL_CAPSULE | Freq: Two times a day (BID) | ORAL | 4 refills | Status: DC

## 2023-11-23 NOTE — Progress Notes (Signed)
 [facility]  PATIENT: Anita Montoya DOB: January 15, 1981  REFERRING CLINICIAN: Jerri Pfeiffer, MD HISTORY FROM: patient  REASON FOR VISIT: follow up   HISTORICAL  CHIEF COMPLAINT:  Chief Complaint  Patient presents with   Follow-up    Patient in room #6 and alone. Patient states to discuss here hospital visit with an stroke. Patient states she ben having headaches and she has a headaches today.    HISTORY OF PRESENT ILLNESS:   UPDATE (11/23/23, VRP): Since last visit, had LP, with opening pressure 26, and now on acetazolamide . Then in Oct 2024, had right sided tingling sensation, and went to ER. Dx'd with left parietal acute infarct. Stroke workup completed. Now recovering. Headaches continue.  PRIOR HPI (02/17/23): 43 year old female here for evaluation of severe headaches and vision loss.  For past 2 weeks patient has had severe frontal throbbing headaches with sensitivity to light and nausea.  On 02/08/2023 patient had 10-minute episode of transient visual obscuration.  She denies tinnitus.  Has history of polycystic ovarian syndrome with long-term obesity.  Weight has been fairly stable.  Has been under more stress lately.  Also having difficult to control blood pressure for the past 1 year.   REVIEW OF SYSTEMS: Full 14 system review of systems performed and negative with exception of: as per HPI.  ALLERGIES: No Known Allergies  HOME MEDICATIONS: Outpatient Medications Prior to Visit  Medication Sig Dispense Refill   albuterol (VENTOLIN HFA) 108 (90 Base) MCG/ACT inhaler Inhale 2 puffs into the lungs every 4 (four) hours as needed.     amLODipine  (NORVASC ) 10 MG tablet Take 1 tablet (10 mg total) by mouth daily. 30 tablet 0   aspirin  81 MG chewable tablet Chew 1 tablet (81 mg total) by mouth daily. 30 tablet 1   losartan -hydrochlorothiazide  (HYZAAR ) 100-25 MG tablet Take 1 tablet by mouth daily. 30 tablet 3   nicotine  polacrilex (NICORETTE ) 4 MG gum Take 1 each (4  mg total) by mouth as needed for smoking cessation. 50 tablet 0   rosuvastatin  (CRESTOR ) 40 MG tablet Take 1 tablet (40 mg total) by mouth daily. 30 tablet 3   spironolactone  (ALDACTONE ) 50 MG tablet Take 1 tablet (50 mg total) by mouth 2 (two) times daily. 60 tablet 0   traZODone (DESYREL) 50 MG tablet Take 50 mg by mouth at bedtime.     acetaZOLAMIDE  ER (DIAMOX ) 500 MG capsule Take 1 capsule (500 mg total) by mouth 2 (two) times daily. 60 capsule 5   clopidogrel  (PLAVIX ) 75 MG tablet Take 1 tablet (75 mg total) by mouth daily. (Patient not taking: Reported on 11/23/2023) 21 tablet 0   No facility-administered medications prior to visit.    PAST MEDICAL HISTORY: Past Medical History:  Diagnosis Date   Duplicated left renal collecting system 2009   noted on 2009 CT abdomen and pelvis    Herpes simplex    HLD (hyperlipidemia) 08/2013   Hypertension 1995   Menorrhagia 1989   Pancreatitis     PAST SURGICAL HISTORY: Past Surgical History:  Procedure Laterality Date   CHOLECYSTECTOMY     gallbadder removal     uterine poly removed   2009   heavy menstrual bleeding     FAMILY HISTORY: Family History  Problem Relation Age of Onset   Clotting disorder Mother    Diabetes Father    Multiple sclerosis Sister    Heart disease Brother     SOCIAL HISTORY: Social History   Socioeconomic History  Marital status: Single    Spouse name: Not on file   Number of children: Not on file   Years of education: Not on file   Highest education level: Not on file  Occupational History   Not on file  Tobacco Use   Smoking status: Every Day    Types: Cigarettes   Smokeless tobacco: Never  Vaping Use   Vaping status: Never Used  Substance and Sexual Activity   Alcohol use: Yes    Comment: occasional   Drug use: No   Sexual activity: Yes    Birth control/protection: None  Other Topics Concern   Not on file  Social History Narrative   Not on file   Social Drivers of Health    Financial Resource Strain: Not on file  Food Insecurity: No Food Insecurity (09/13/2023)   Hunger Vital Sign    Worried About Running Out of Food in the Last Year: Never true    Ran Out of Food in the Last Year: Never true  Recent Concern: Food Insecurity - Medium Risk (08/23/2023)   Received from [facility]   Hunger Vital Sign    Worried About Running Out of Food in the Last Year: Sometimes true    Ran Out of Food in the Last Year: Sometimes true  Transportation Needs: No Transportation Needs (09/13/2023)   PRAPARE - Administrator, Civil Service (Medical): No    Lack of Transportation (Non-Medical): No  Physical Activity: Not on file  Stress: Not on file  Social Connections: Unknown (03/15/2022)   Received from [facility]   Social Network    Social Network: Not on file  Intimate Partner Violence: Not At Risk (09/13/2023)   Humiliation, Afraid, Rape, and Kick questionnaire    Fear of Current or Ex-Partner: No    Emotionally Abused: No    Physically Abused: No    Sexually Abused: No     PHYSICAL EXAM  GENERAL EXAM/CONSTITUTIONAL: Vitals:  Vitals:   11/23/23 1029  BP: (!) 148/92  Pulse: (!) 103  Weight: (!) 312 lb (141.5 kg)  Height: 5' 4 (1.626 m)   Body mass index is 53.55 kg/m. Wt Readings from Last 10 Encounters:  11/23/23 (!) 312 lb (141.5 kg)  09/14/23 (!) 306 lb 10.6 oz (139.1 kg)  02/28/23 295 lb (133.8 kg)  02/17/23 (!) 302 lb 12.8 oz (137.3 kg)  02/10/23 300 lb (136.1 kg)  10/24/19 294 lb (133.4 kg)  05/20/17 285 lb (129.3 kg)  03/19/15 297 lb (134.7 kg)  03/09/15 300 lb (136.1 kg)  06/10/14 (!) 310 lb (140.6 kg)   Patient is in no distress; well developed, nourished and groomed; neck is supple  CARDIOVASCULAR: Examination of carotid arteries is normal; no carotid bruits Regular rate and rhythm, no murmurs Examination of peripheral vascular system by observation and palpation is normal  EYES: Ophthalmoscopic exam  of optic discs and posterior segments is normal; no papilledema or hemorrhages No results found.  MUSCULOSKELETAL: Gait, strength, tone, movements noted in Neurologic exam below  NEUROLOGIC: MENTAL STATUS:      No data to display         awake, alert, oriented to person, place and time recent and remote memory intact normal attention and concentration language fluent, comprehension intact, naming intact fund of knowledge appropriate  CRANIAL NERVE:  2nd - MILD BLURRED OPTIC DISC MARGINS 2nd, 3rd, 4th, 6th - pupils equal and reactive to light, visual fields full to confrontation, extraocular muscles  intact, no nystagmus 5th - facial sensation symmetric 7th - facial strength symmetric 8th - hearing intact 9th - palate elevates symmetrically, uvula midline 11th - shoulder shrug symmetric 12th - tongue protrusion midline  MOTOR:  normal bulk and tone, full strength in the BUE, BLE  SENSORY:  normal and symmetric to light touch, temperature, vibration  COORDINATION:  finger-nose-finger, fine finger movements normal  REFLEXES:  deep tendon reflexes present and symmetric  GAIT/STATION:  narrow based gait     DIAGNOSTIC DATA (LABS, IMAGING, TESTING) - I reviewed patient records, labs, notes, testing and imaging myself where available.  Lab Results  Component Value Date   WBC 9.8 09/14/2023   HGB 14.4 09/14/2023   HCT 43.9 09/14/2023   MCV 79.1 (L) 09/14/2023   PLT 422 (H) 09/14/2023      Component Value Date/Time   NA 135 09/14/2023 0626   K 3.7 09/14/2023 0626   CL 106 09/14/2023 0626   CO2 23 09/14/2023 0626   GLUCOSE 108 (H) 09/14/2023 0626   BUN 13 09/14/2023 0626   CREATININE 0.77 09/14/2023 0626   CREATININE 0.69 12/15/2013 1100   CALCIUM  8.6 (L) 09/14/2023 0626   PROT 7.7 09/12/2023 2105   ALBUMIN 3.5 09/12/2023 2105   AST 14 (L) 09/12/2023 2105   ALT 16 09/12/2023 2105   ALKPHOS 77 09/12/2023 2105   BILITOT 0.3 09/12/2023 2105   GFRNONAA >60  09/14/2023 0626   GFRAA >90 03/09/2015 2305   Lab Results  Component Value Date   CHOL 154 09/13/2023   HDL 43 09/13/2023   LDLCALC 102 (H) 09/13/2023   TRIG 47 09/13/2023   CHOLHDL 3.6 09/13/2023   Lab Results  Component Value Date   HGBA1C 5.9 (H) 09/13/2023   No results found for: VITAMINB12 Lab Results  Component Value Date   TSH 1.024 12/15/2013    02/10/23 MRI brain [I reviewed images myself and agree with interpretation. -VRP]  1. No acute intracranial process. 2. Chronic infarct of the pons. 3. Multiple small subcortical and periventricular T2/FLAIR hyperintense lesions, which in the absence of history of demyelinating disease are favored to represents changes related to chronic microvascular ischemic change.  02/18/23 LP - Successful fluoroscopically guided lumbar puncture. Elevated opening pressure of 26 cm water.  09/13/23 Brain MRI: [I reviewed images myself and agree with interpretation. -VRP]  1. 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 2. Chronic lacunar infarct in the left pons.   09/13/23 Cervical MRI: Unremarkable.  Normal cord signal.   ASSESSMENT AND PLAN  43 y.o. year old female here with:   Dx:  1. IIH (idiopathic intracranial hypertension)   2. Cerebrovascular accident (CVA) due to embolism of left middle cerebral artery (HCC)   3. Primary hypertension   4. Hyperlipidemia, unspecified hyperlipidemia type   5. OSA (obstructive sleep apnea)      PLAN:  Acute ischemic infarct: Left frontal parietal cortex infarct, embolic pattern, etiology not quite clear, concerning for cardioembolic source but patient does have multiple stroke risk factors Code Stroke CT head: No acute abnormality.  CTA head & neck: No LVO or significant stenosis MRI  Acute left frontal parietal cortex infarct. Chronic lacunar left pons infarct 2D Echo: LVEF 60-65%, Negative bubble Study TCD with Bubble Study: No window obtained.  US  LE venous  Doppler: Negative DVT TEE --> 10/06/23 --> No cardiac source of emboli noted. Would recommend dedicated carotid and-or aorta imaging for stroke work up: Recommend patient to follow-up with her cardiologist  Dr. Arne for implanted prolonged cardiac monitoring to rule out A-fib LDL 102 HgbA1c 5.9 UDS negative VTE prophylaxis - lovenox  No antithrombotic prior to admission, treated with aspirin  81 mg daily and clopidogrel  75 mg daily for 3 weeks --> now aspirin  81mg  daily alone.   Pseudotumor cerebri Follow-up with Dr. Margaret at [facility]  and Topamax  Continue home medication Follow-up with Dr. Margaret in 4 weeks   Hypertension Home meds:  losartan  100mg  Stable Long-term BP goal normotensive   Hyperlipidemia Home meds:  Lipitor 10mg  LDL 102, goal < 70 Add Crestor  40mg   Continue statin at discharge   Tobacco Abuse Current smoker Nicotine  replacement therapy and cessation education provided   Other Stroke Risk Factors Obesity, Body mass index is 52.64 kg/m., BMI >/= 30 associated with increased stroke risk, recommend weight loss, diet and exercise as appropriate  Severe obstructive sleep apnea, waiting for CPAP to be delivered at home Migraines    idiopathic intracranial hypertension (pseudotumor cerebri) --> since March 2024; with transient visual obscuration on 02/08/23 - increase acetazolamide  to 1000mg  twice a day  - follow up with ophthalmology evaluations - medical weight mgmt referral (BMI > 50; PCOS) - follow up BP control with PCP  Meds ordered this encounter  Medications   acetaZOLAMIDE  ER (DIAMOX ) 500 MG capsule    Sig: Take 2 capsules (1,000 mg total) by mouth 2 (two) times daily.    Dispense:  360 capsule    Refill:  4   Return in about 6 months (around 05/22/2024) for MyChart visit (15 min).  I reviewed images, labs, notes, records myself. I summarized findings and reviewed with patient, for this high risk condition (stroke and IIH) requiring high  complexity decision making.   EDUARD FABIENE MARGARET, MD 11/23/2023, 11:10 AM Certified in Neurology, Neurophysiology and Neuroimaging  [facility] [address], [address] [place], [place] [address] [phone]

## 2023-11-23 NOTE — Patient Instructions (Signed)
 idiopathic intracranial hypertension (pseudotumor cerebri) --> since March 2024; with transient visual obscuration on 02/08/23 - increase acetazolamide  to 1000mg  twice a day  - follow up with ophthalmology evaluations - medical weight mgmt referral (BMI > 50; PCOS)   STROKE PREVENTION - continue aspirin  81mg  daily, statin, BP control - follow up sleep apnea treatments - follow up implanted heart monitor

## 2024-02-23 ENCOUNTER — Other Ambulatory Visit (HOSPITAL_BASED_OUTPATIENT_CLINIC_OR_DEPARTMENT_OTHER): Payer: Self-pay

## 2024-03-19 ENCOUNTER — Other Ambulatory Visit (HOSPITAL_BASED_OUTPATIENT_CLINIC_OR_DEPARTMENT_OTHER): Payer: Self-pay

## 2024-03-29 ENCOUNTER — Other Ambulatory Visit (HOSPITAL_BASED_OUTPATIENT_CLINIC_OR_DEPARTMENT_OTHER): Payer: Self-pay

## 2024-05-22 ENCOUNTER — Telehealth: Payer: 59 | Admitting: Diagnostic Neuroimaging

## 2024-05-31 ENCOUNTER — Telehealth: Payer: Self-pay | Admitting: Diagnostic Neuroimaging

## 2024-05-31 ENCOUNTER — Encounter: Payer: Self-pay | Admitting: Diagnostic Neuroimaging

## 2024-05-31 ENCOUNTER — Telehealth (INDEPENDENT_AMBULATORY_CARE_PROVIDER_SITE_OTHER): Admitting: Diagnostic Neuroimaging

## 2024-05-31 DIAGNOSIS — I10 Essential (primary) hypertension: Secondary | ICD-10-CM | POA: Diagnosis not present

## 2024-05-31 DIAGNOSIS — G932 Benign intracranial hypertension: Secondary | ICD-10-CM | POA: Diagnosis not present

## 2024-05-31 DIAGNOSIS — G4733 Obstructive sleep apnea (adult) (pediatric): Secondary | ICD-10-CM

## 2024-05-31 DIAGNOSIS — I63412 Cerebral infarction due to embolism of left middle cerebral artery: Secondary | ICD-10-CM | POA: Diagnosis not present

## 2024-05-31 DIAGNOSIS — E785 Hyperlipidemia, unspecified: Secondary | ICD-10-CM

## 2024-05-31 MED ORDER — ACETAZOLAMIDE ER 500 MG PO CP12: 1000.0000 mg | ORAL_CAPSULE | Freq: Two times a day (BID) | ORAL | 4 refills | Status: AC

## 2024-05-31 NOTE — Progress Notes (Signed)
 [facility]  PATIENT: Anita Montoya DOB: Dec 14, 1980  REFERRING CLINICIAN: Fulbright, [place]  E, * HISTORY FROM: patient  REASON FOR VISIT: follow up   HISTORICAL  CHIEF COMPLAINT:  Chief Complaint  Patient presents with   iih and headaches    HISTORY OF PRESENT ILLNESS:   UPDATE (05/31/24, VRP): Since last visit, doing about the same. 2-3 HA per week. Tolerating meds. Has not been to eye clinic. Now seeing weight mgmt clinic.  Some mild intermittent bilateral finger tingling sensations are persistent.  UPDATE (11/23/23, VRP): Since last visit, had LP, with opening pressure 26, and now on acetazolamide . Then in Oct 2024, had right sided tingling sensation, and went to ER. Dx'd with left parietal acute infarct. Stroke workup completed. Now recovering. Headaches continue.  PRIOR HPI (02/17/23): 43 year old female here for evaluation of severe headaches and vision loss.  For past 2 weeks patient has had severe frontal throbbing headaches with sensitivity to light and nausea.  On 02/08/2023 patient had 10-minute episode of transient visual obscuration.  She denies tinnitus.  Has history of polycystic ovarian syndrome with long-term obesity.  Weight has been fairly stable.  Has been under more stress lately.  Also having difficult to control blood pressure for the past 1 year.   REVIEW OF SYSTEMS: Full 14 system review of systems performed and negative with exception of: as per HPI.  ALLERGIES: No Known Allergies  HOME MEDICATIONS: Outpatient Medications Prior to Visit  Medication Sig Dispense Refill   albuterol (VENTOLIN HFA) 108 (90 Base) MCG/ACT inhaler Inhale 2 puffs into the lungs every 4 (four) hours as needed.     amLODipine  (NORVASC ) 10 MG tablet Take 1 tablet (10 mg total) by mouth daily. 30 tablet 0   aspirin  81 MG chewable tablet Chew 1 tablet (81 mg total) by mouth daily. 30 tablet 1   losartan -hydrochlorothiazide  (HYZAAR ) 100-25 MG tablet Take 1 tablet  by mouth daily. 30 tablet 3   nicotine  polacrilex (NICORETTE ) 4 MG gum Take 1 each (4 mg total) by mouth as needed for smoking cessation. 50 tablet 0   rosuvastatin  (CRESTOR ) 40 MG tablet Take 1 tablet (40 mg total) by mouth daily. 30 tablet 3   spironolactone  (ALDACTONE ) 50 MG tablet Take 1 tablet (50 mg total) by mouth 2 (two) times daily. 60 tablet 0   traZODone (DESYREL) 50 MG tablet Take 50 mg by mouth at bedtime.     acetaZOLAMIDE  ER (DIAMOX ) 500 MG capsule Take 2 capsules (1,000 mg total) by mouth 2 (two) times daily. 360 capsule 4   No facility-administered medications prior to visit.    PAST MEDICAL HISTORY: Past Medical History:  Diagnosis Date   Duplicated left renal collecting system 2009   noted on 2009 CT abdomen and pelvis    Herpes simplex    HLD (hyperlipidemia) 08/2013   Hypertension 1995   Menorrhagia 1989   Pancreatitis     PAST SURGICAL HISTORY: Past Surgical History:  Procedure Laterality Date   CHOLECYSTECTOMY     gallbadder removal     uterine poly removed   2009   heavy menstrual bleeding     FAMILY HISTORY: Family History  Problem Relation Age of Onset   Clotting disorder Mother    Diabetes Father    Multiple sclerosis Sister    Heart disease Brother     SOCIAL HISTORY: Social History   Socioeconomic History   Marital status: Single    Spouse name: Not on file   Number of  children: Not on file   Years of education: Not on file   Highest education level: Not on file  Occupational History   Not on file  Tobacco Use   Smoking status: Every Day    Types: Cigarettes   Smokeless tobacco: Never  Vaping Use   Vaping status: Never Used  Substance and Sexual Activity   Alcohol use: Yes    Comment: occasional   Drug use: No   Sexual activity: Yes    Birth control/protection: None  Other Topics Concern   Not on file  Social History Narrative   Not on file   Social Drivers of Health   Financial Resource Strain: Not on file  Food  Insecurity: No Food Insecurity (09/13/2023)   Hunger Vital Sign    Worried About Running Out of Food in the Last Year: Never true    Ran Out of Food in the Last Year: Never true  Recent Concern: Food Insecurity - Medium Risk (08/23/2023)   Received from [facility]   Hunger Vital Sign    Worried About Running Out of Food in the Last Year: Sometimes true    Ran Out of Food in the Last Year: Sometimes true  Transportation Needs: No Transportation Needs (09/13/2023)   PRAPARE - Administrator, Civil Service (Medical): No    Lack of Transportation (Non-Medical): No  Physical Activity: Not on file  Stress: Not on file  Social Connections: Unknown (03/15/2022)   Received from [facility]   Social Network    Social Network: Not on file  Intimate Partner Violence: Not At Risk (09/13/2023)   Humiliation, Afraid, Rape, and Kick questionnaire    Fear of Current or Ex-Partner: No    Emotionally Abused: No    Physically Abused: No    Sexually Abused: No     PHYSICAL EXAM  GENERAL EXAM/CONSTITUTIONAL: Vitals:  There were no vitals filed for this visit.  There is no height or weight on file to calculate BMI. Wt Readings from Last 10 Encounters:  11/23/23 (!) 312 lb (141.5 kg)  09/14/23 (!) 306 lb 10.6 oz (139.1 kg)  02/28/23 295 lb (133.8 kg)  02/17/23 (!) 302 lb 12.8 oz (137.3 kg)  02/10/23 300 lb (136.1 kg)  10/24/19 294 lb (133.4 kg)  05/20/17 285 lb (129.3 kg)  03/19/15 297 lb (134.7 kg)  03/09/15 300 lb (136.1 kg)  06/10/14 (!) 310 lb (140.6 kg)   Patient is in no distress; well developed, nourished and groomed; neck is supple  CARDIOVASCULAR: Examination of carotid arteries is normal; no carotid bruits Regular rate and rhythm, no murmurs Examination of peripheral vascular system by observation and palpation is normal  EYES: Ophthalmoscopic exam of optic discs and posterior segments is normal; no papilledema or hemorrhages No results  found.  MUSCULOSKELETAL: Gait, strength, tone, movements noted in Neurologic exam below  NEUROLOGIC: MENTAL STATUS:      No data to display         awake, alert, oriented to person, place and time recent and remote memory intact normal attention and concentration language fluent, comprehension intact, naming intact fund of knowledge appropriate  CRANIAL NERVE:  2nd - MILD BLURRED OPTIC DISC MARGINS 2nd, 3rd, 4th, 6th - pupils equal and reactive to light, visual fields full to confrontation, extraocular muscles intact, no nystagmus 5th - facial sensation symmetric 7th - facial strength symmetric 8th - hearing intact 9th - palate elevates symmetrically, uvula midline 11th - shoulder shrug symmetric 12th -  tongue protrusion midline  MOTOR:  normal bulk and tone, full strength in the BUE, BLE  SENSORY:  normal and symmetric to light touch, temperature, vibration  COORDINATION:  finger-nose-finger, fine finger movements normal  REFLEXES:  deep tendon reflexes present and symmetric  GAIT/STATION:  narrow based gait     DIAGNOSTIC DATA (LABS, IMAGING, TESTING) - I reviewed patient records, labs, notes, testing and imaging myself where available.  Lab Results  Component Value Date   WBC 9.8 09/14/2023   HGB 14.4 09/14/2023   HCT 43.9 09/14/2023   MCV 79.1 (L) 09/14/2023   PLT 422 (H) 09/14/2023      Component Value Date/Time   NA 135 09/14/2023 0626   K 3.7 09/14/2023 0626   CL 106 09/14/2023 0626   CO2 23 09/14/2023 0626   GLUCOSE 108 (H) 09/14/2023 0626   BUN 13 09/14/2023 0626   CREATININE 0.77 09/14/2023 0626   CREATININE 0.69 12/15/2013 1100   CALCIUM  8.6 (L) 09/14/2023 0626   PROT 7.7 09/12/2023 2105   ALBUMIN 3.5 09/12/2023 2105   AST 14 (L) 09/12/2023 2105   ALT 16 09/12/2023 2105   ALKPHOS 77 09/12/2023 2105   BILITOT 0.3 09/12/2023 2105   GFRNONAA >60 09/14/2023 0626   GFRAA >90 03/09/2015 2305   Lab Results  Component Value Date    CHOL 154 09/13/2023   HDL 43 09/13/2023   LDLCALC 102 (H) 09/13/2023   TRIG 47 09/13/2023   CHOLHDL 3.6 09/13/2023   Lab Results  Component Value Date   HGBA1C 5.9 (H) 09/13/2023   No results found for: VITAMINB12 Lab Results  Component Value Date   TSH 1.024 12/15/2013    02/10/23 MRI brain [I reviewed images myself and agree with interpretation. -VRP]  1. No acute intracranial process. 2. Chronic infarct of the pons. 3. Multiple small subcortical and periventricular T2/FLAIR hyperintense lesions, which in the absence of history of demyelinating disease are favored to represents changes related to chronic microvascular ischemic change.  02/18/23 LP - Successful fluoroscopically guided lumbar puncture. Elevated opening pressure of 26 cm water.  09/13/23 Brain MRI: [I reviewed images myself and agree with interpretation. -VRP]  1. 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 2. Chronic lacunar infarct in the left pons.   09/13/23 Cervical MRI: Unremarkable.  Normal cord signal.   ASSESSMENT AND PLAN  43 y.o. year old female here with:   Dx:  1. IIH (idiopathic intracranial hypertension)   2. Cerebrovascular accident (CVA) due to embolism of left middle cerebral artery (HCC)   3. Primary hypertension   4. Hyperlipidemia, unspecified hyperlipidemia type   5. OSA (obstructive sleep apnea)       PLAN:  Left frontal parietal cortex infarct, embolic pattern, etiology not quite clear, concerning for cardioembolic source but patient does have multiple stroke risk factors Continue implanted prolonged cardiac monitoring to rule out A-fib continue aspirin  81mg  daily  continue BP and lipid contol contnue CPAP for severe OSA  idiopathic intracranial hypertension (pseudotumor cerebri) --> since March 2024; with transient visual obscuration on 02/08/23 - continue acetazolamide  to 1000mg  twice a day  - follow up with ophthalmology evaluation (requesting new  referral in [place]) - medical weight mgmt referral (BMI > 50; PCOS) - follow up BP control with PCP  MILD NUMBNESS / TINGLING (bilateral hands) - could be due to acetazolamide  side effect; mild symptoms we will evaluate in office at next visit  Meds ordered this encounter  Medications   acetaZOLAMIDE   ER (DIAMOX ) 500 MG capsule    Sig: Take 2 capsules (1,000 mg total) by mouth 2 (two) times daily.    Dispense:  360 capsule    Refill:  4   Orders Placed This Encounter  Procedures   Ambulatory referral to Ophthalmology   Return in about 3 months (around 08/31/2024).  Virtual Visit via Video Note  I connected with Anita Montoya on 05/31/2024 at  2:30 PM EDT by a video enabled telemedicine application and verified that I am speaking with the correct person using two identifiers.   I discussed the limitations of evaluation and management by telemedicine and the availability of in person appointments. The patient expressed understanding and agreed to proceed.  Patient is at home and I am at the office.   I spent 15 minutes of face-to-face and non-face-to-face time with patient.  This included previsit chart review, lab review, study review, order entry, electronic health record documentation, patient education.     EDUARD FABIENE HANLON, MD 05/31/2024, 2:35 PM Certified in Neurology, Neurophysiology and Neuroimaging  [facility] [address], [address] [place], [place] [address] [phone]

## 2024-05-31 NOTE — Telephone Encounter (Signed)
Referral for ophthalmology fax to [facility]. Phone: [phone], Fax: [phone]

## 2024-06-07 NOTE — Telephone Encounter (Signed)
 Received Fax From [facility] stating  They will not be able to assist Pt due to Insurance is OON.

## 2024-06-13 NOTE — Telephone Encounter (Signed)
 Referral for ophthalmology refaxed to [facility]. Phone: [phone], Fax: [phone]

## 2024-07-02 ENCOUNTER — Telehealth: Payer: Self-pay | Admitting: Diagnostic Neuroimaging

## 2024-07-02 ENCOUNTER — Other Ambulatory Visit (HOSPITAL_COMMUNITY): Payer: Self-pay

## 2024-07-02 NOTE — Telephone Encounter (Signed)
 Pt being told this medication acetaZOLAMIDE  ER (DIAMOX ) 500 MG capsule is not covered by her insurance, requesting this be reviewed to see if a PA is needed

## 2024-07-02 NOTE — Telephone Encounter (Signed)
 Pharmacy Patient Advocate Encounter   Received notification from Physician's Office that prior authorization for Acetazolamide  ER 500mg  Capsule is required/requested.   Insurance verification completed.   The patient is insured through [employer] .   Per test claim: PA required; PA submitted to above mentioned insurance via Latent Key/confirmation #/EOC [id number] Status is pending

## 2024-07-05 ENCOUNTER — Other Ambulatory Visit (HOSPITAL_COMMUNITY): Payer: Self-pay

## 2024-07-05 NOTE — Telephone Encounter (Signed)
 Pharmacy Patient Advocate Encounter  Received notification from [employer] that Prior Authorization for Acetazolamide  ER Capsule 12hr 500mg  has been DENIED.  Full denial letter will be uploaded to the media tab. See denial reason below.     PA #/Case ID/Reference #: [id number]

## 2024-07-05 NOTE — Telephone Encounter (Signed)
   It only allow 60 per 30DS.

## 2024-07-09 ENCOUNTER — Other Ambulatory Visit (HOSPITAL_COMMUNITY): Payer: Self-pay

## 2024-07-09 ENCOUNTER — Other Ambulatory Visit: Payer: Self-pay | Admitting: Neurology

## 2024-07-09 NOTE — Telephone Encounter (Signed)
 I asked our Pharmacist to check behind me on this PA response. The dose is outside of the FDA labeling and therefore that dose is denied. Please review the denial below as it includes the denial reasons beyond just the QTY per 30DS etc. It also includes what would be needed to be approved.

## 2024-07-10 ENCOUNTER — Other Ambulatory Visit: Payer: Self-pay | Admitting: Neurology

## 2024-07-10 ENCOUNTER — Telehealth: Payer: Self-pay | Admitting: Pharmacist

## 2024-07-10 NOTE — Telephone Encounter (Signed)
 I called and spoke with the patient to confirm. The patient has been on this dose since 2024. She is taking the medication and says that he has been filling it previously with [facility]. The dose was increased in Jan 2025. You can submit those notes as well indicating that is when the medication was increased.   Jan 2025 note- in the plan-  idiopathic intracranial hypertension (pseudotumor cerebri) --> since March 2024; with transient visual obscuration on 02/08/23 - increase acetazolamide  to 1000mg  twice a day     She currently is having to get it filled through [facility]. Dr Margaret had also documented in recent note that she was   (05/31/24, VRP): Since last visit, doing about the same. 2-3 HA per week. Tolerating meds.  Hopefully this will help

## 2024-07-10 NOTE — Telephone Encounter (Signed)
 I am happy to submit an appeal for higher-than-recommended dosing of acetazolamide  for Anita Montoya using the citation you provided. However, based on available dispense records, the patient has not filled this prescription in over a year. While it was noted that she has been on this dose, I have not been able to find documentation supporting this claim. Would you be able to provide dispense records or other documentation to confirm her use of this regimen? Having this information would significantly strengthen the appeal, particularly if she has been on this dose previously and has done well with it.  Please advise.  Thank you, Devere Pandy, PharmD Clinical Pharmacist  [place]  Direct Dial: [phone]

## 2024-07-10 NOTE — Telephone Encounter (Signed)
 Appeal has been submitted. Will advise when response is received, please be advised that most companies may take 30 days to make a decision.Appeal letter and supporting documentation have been faxed to [phone] on 07/10/2024 @4 :49 pm.  Thank you, Devere Pandy, PharmD Clinical Pharmacist  [place]  Direct Dial: [phone]

## 2024-07-10 NOTE — Telephone Encounter (Signed)
 Safety and Tolerability of Acetazolamide  in the Idiopathic Intracranial Hypertension Treatment Trial [address] Gladis MOHR MD; Saturnino Barnie BUSS MD; Tobie Shirlyn CORDOBA MD; Briana Sara PhD; Edith Sharper MD; Marykay Sharper SQUIBB. PhD for the [employer] Idiopathic Intracranial Hypertension Study Group  [email or web address]  Conclusion:  Acetazolamide  appears to have an acceptable safety profile at dosages up to 4 g/d in the treatment of idiopathic intracranial hypertension. The majority of participants in the Idiopathic Intracranial Hypertension Treatment Trial were able to tolerate acetazolamide  above 1 g/d for 6 months.   See the above article outlining safety of acetazolamide  up to 4000mg  daily.  Patient needs this dose to protect her vision and because symptoms are not controlled on lower dose.    EDUARD FABIENE HANLON, MD 07/10/2024, 1:06 PM Certified in Neurology, Neurophysiology and Neuroimaging  [facility] [address] [place], [place] [address] [phone]

## 2024-07-12 NOTE — Telephone Encounter (Signed)
 QBP:[facility]) your part time , Dr. Jadine booking out the August 2026. Wanted to make you aware in case you wiould like to schedule with another office. Have left patient a message letting her know if she wanted to schedule appointment and be put on the waitlist.

## 2024-07-17 ENCOUNTER — Telehealth: Admitting: Diagnostic Neuroimaging

## 2024-07-18 NOTE — Telephone Encounter (Signed)
 Additional information has been requested from the patient's insurance in order to proceed with the appeal request. Requested information has been sent, or form has been filled out and faxed back to [phone]

## 2024-07-23 ENCOUNTER — Other Ambulatory Visit (HOSPITAL_COMMUNITY): Payer: Self-pay

## 2024-07-23 NOTE — Telephone Encounter (Signed)
 The appeal for acetazolamide  has been approved by the insurance, full letter has been uploaded to the media tab.    Thank you, Devere Pandy, PharmD Clinical Pharmacist  [place]  Direct Dial: [phone]
